# Patient Record
Sex: Male | Born: 1950 | Race: Black or African American | Hispanic: No | Marital: Married | Smoking: Current every day smoker
Health system: Southern US, Community
[De-identification: ages and names within clinical notes are randomized; demographics above are authoritative.]

---

## 2017-08-12 DIAGNOSIS — R69 Illness, unspecified: Secondary | ICD-10-CM | POA: Diagnosis not present

## 2017-10-24 DIAGNOSIS — G4733 Obstructive sleep apnea (adult) (pediatric): Secondary | ICD-10-CM | POA: Diagnosis not present

## 2018-03-26 DIAGNOSIS — M25561 Pain in right knee: Secondary | ICD-10-CM | POA: Diagnosis not present

## 2018-03-26 DIAGNOSIS — Z6841 Body Mass Index (BMI) 40.0 and over, adult: Secondary | ICD-10-CM | POA: Diagnosis not present

## 2018-08-13 DIAGNOSIS — M25562 Pain in left knee: Secondary | ICD-10-CM | POA: Diagnosis not present

## 2018-08-13 DIAGNOSIS — M170 Bilateral primary osteoarthritis of knee: Secondary | ICD-10-CM | POA: Diagnosis not present

## 2018-08-13 DIAGNOSIS — M25561 Pain in right knee: Secondary | ICD-10-CM | POA: Diagnosis not present

## 2018-08-21 DIAGNOSIS — M25561 Pain in right knee: Secondary | ICD-10-CM | POA: Diagnosis not present

## 2018-08-21 DIAGNOSIS — M1711 Unilateral primary osteoarthritis, right knee: Secondary | ICD-10-CM | POA: Diagnosis not present

## 2018-08-21 DIAGNOSIS — M25562 Pain in left knee: Secondary | ICD-10-CM | POA: Diagnosis not present

## 2018-08-21 DIAGNOSIS — M170 Bilateral primary osteoarthritis of knee: Secondary | ICD-10-CM | POA: Diagnosis not present

## 2018-08-28 DIAGNOSIS — R69 Illness, unspecified: Secondary | ICD-10-CM | POA: Diagnosis not present

## 2018-10-06 DIAGNOSIS — M25561 Pain in right knee: Secondary | ICD-10-CM | POA: Diagnosis not present

## 2018-10-06 DIAGNOSIS — M1711 Unilateral primary osteoarthritis, right knee: Secondary | ICD-10-CM | POA: Diagnosis not present

## 2018-11-25 DIAGNOSIS — M25462 Effusion, left knee: Secondary | ICD-10-CM | POA: Diagnosis not present

## 2018-11-25 DIAGNOSIS — M25461 Effusion, right knee: Secondary | ICD-10-CM | POA: Diagnosis not present

## 2018-11-25 DIAGNOSIS — M25561 Pain in right knee: Secondary | ICD-10-CM | POA: Diagnosis not present

## 2018-11-25 DIAGNOSIS — M170 Bilateral primary osteoarthritis of knee: Secondary | ICD-10-CM | POA: Diagnosis not present

## 2018-11-25 DIAGNOSIS — M25562 Pain in left knee: Secondary | ICD-10-CM | POA: Diagnosis not present

## 2019-03-25 DIAGNOSIS — L918 Other hypertrophic disorders of the skin: Secondary | ICD-10-CM | POA: Diagnosis not present

## 2019-03-25 DIAGNOSIS — L723 Sebaceous cyst: Secondary | ICD-10-CM | POA: Diagnosis not present

## 2019-03-25 DIAGNOSIS — I10 Essential (primary) hypertension: Secondary | ICD-10-CM | POA: Diagnosis not present

## 2019-03-25 DIAGNOSIS — M25561 Pain in right knee: Secondary | ICD-10-CM | POA: Diagnosis not present

## 2019-05-08 DIAGNOSIS — S0180XA Unspecified open wound of other part of head, initial encounter: Secondary | ICD-10-CM | POA: Diagnosis not present

## 2019-05-08 DIAGNOSIS — S0100XA Unspecified open wound of scalp, initial encounter: Secondary | ICD-10-CM | POA: Diagnosis not present

## 2019-05-15 DIAGNOSIS — Z4802 Encounter for removal of sutures: Secondary | ICD-10-CM | POA: Diagnosis not present

## 2019-08-06 DIAGNOSIS — R600 Localized edema: Secondary | ICD-10-CM | POA: Diagnosis not present

## 2019-08-06 DIAGNOSIS — M109 Gout, unspecified: Secondary | ICD-10-CM | POA: Diagnosis not present

## 2019-08-06 DIAGNOSIS — M79672 Pain in left foot: Secondary | ICD-10-CM | POA: Diagnosis not present

## 2019-08-06 DIAGNOSIS — I10 Essential (primary) hypertension: Secondary | ICD-10-CM | POA: Diagnosis not present

## 2019-09-11 DIAGNOSIS — R69 Illness, unspecified: Secondary | ICD-10-CM | POA: Diagnosis not present

## 2020-01-08 ENCOUNTER — Ambulatory Visit: Payer: Medicare HMO | Attending: Internal Medicine

## 2020-01-08 DIAGNOSIS — Z23 Encounter for immunization: Secondary | ICD-10-CM

## 2020-01-08 NOTE — Progress Notes (Signed)
   Covid-19 Vaccination Clinic  Name:  Kaiea Esselman    MRN: [id number] DOB: Oct 18, 1951  01/08/2020  Mr. Shaff was observed post Covid-19 immunization for 15 minutes without incidence. He was provided with Vaccine Information Sheet and instruction to access the V-Safe system.   Mr. Heckard was instructed to call 911 with any severe reactions post vaccine: Marland Kitchen Difficulty breathing  . Swelling of your face and throat  . A fast heartbeat  . A bad rash all over your body  . Dizziness and weakness    Immunizations Administered    Name Date Dose VIS Date Route   Pfizer COVID-19 Vaccine 01/08/2020 10:34 AM 0.3 mL 11/27/2019 Intramuscular   Manufacturer: [employer], [employer]   Lot: [id number]   NDC: [address]-1

## 2020-01-29 ENCOUNTER — Ambulatory Visit: Payer: Medicare HMO | Attending: Internal Medicine

## 2020-01-29 DIAGNOSIS — Z23 Encounter for immunization: Secondary | ICD-10-CM | POA: Insufficient documentation

## 2020-01-29 NOTE — Progress Notes (Signed)
   Covid-19 Vaccination Clinic  Name:  Todd Rasmussen    MRN: [id number] DOB: Jul 10, 1951  01/29/2020  Mr. Todd Rasmussen was observed post Covid-19 immunization for 15 minutes without incidence. He was provided with Vaccine Information Sheet and instruction to access the V-Safe system.   Mr. Todd Rasmussen was instructed to call 911 with any severe reactions post vaccine: Marland Kitchen Difficulty breathing  . Swelling of your face and throat  . A fast heartbeat  . A bad rash all over your body  . Dizziness and weakness    Immunizations Administered    Name Date Dose VIS Date Route   Pfizer COVID-19 Vaccine 01/29/2020  1:11 PM 0.3 mL 11/27/2019 Intramuscular   Manufacturer: [employer], [employer]   Lot: [id number]   NDC: [address]-8

## 2020-05-10 DIAGNOSIS — E669 Obesity, unspecified: Secondary | ICD-10-CM | POA: Diagnosis not present

## 2020-05-10 DIAGNOSIS — M109 Gout, unspecified: Secondary | ICD-10-CM | POA: Diagnosis not present

## 2020-05-10 DIAGNOSIS — G4733 Obstructive sleep apnea (adult) (pediatric): Secondary | ICD-10-CM | POA: Diagnosis not present

## 2020-05-10 DIAGNOSIS — J302 Other seasonal allergic rhinitis: Secondary | ICD-10-CM | POA: Diagnosis not present

## 2020-05-10 DIAGNOSIS — I10 Essential (primary) hypertension: Secondary | ICD-10-CM | POA: Diagnosis not present

## 2020-05-13 DIAGNOSIS — Z0000 Encounter for general adult medical examination without abnormal findings: Secondary | ICD-10-CM | POA: Diagnosis not present

## 2020-09-25 DIAGNOSIS — R69 Illness, unspecified: Secondary | ICD-10-CM | POA: Diagnosis not present

## 2020-11-17 DIAGNOSIS — M109 Gout, unspecified: Secondary | ICD-10-CM | POA: Diagnosis not present

## 2020-11-17 DIAGNOSIS — I10 Essential (primary) hypertension: Secondary | ICD-10-CM | POA: Diagnosis not present

## 2020-11-17 DIAGNOSIS — Z125 Encounter for screening for malignant neoplasm of prostate: Secondary | ICD-10-CM | POA: Diagnosis not present

## 2020-11-17 DIAGNOSIS — E291 Testicular hypofunction: Secondary | ICD-10-CM | POA: Diagnosis not present

## 2020-11-24 DIAGNOSIS — I10 Essential (primary) hypertension: Secondary | ICD-10-CM | POA: Diagnosis not present

## 2020-11-24 DIAGNOSIS — Z0000 Encounter for general adult medical examination without abnormal findings: Secondary | ICD-10-CM | POA: Diagnosis not present

## 2020-11-24 DIAGNOSIS — Z1212 Encounter for screening for malignant neoplasm of rectum: Secondary | ICD-10-CM | POA: Diagnosis not present

## 2020-11-24 DIAGNOSIS — E291 Testicular hypofunction: Secondary | ICD-10-CM | POA: Diagnosis not present

## 2020-11-24 DIAGNOSIS — R82998 Other abnormal findings in urine: Secondary | ICD-10-CM | POA: Diagnosis not present

## 2020-11-24 DIAGNOSIS — R768 Other specified abnormal immunological findings in serum: Secondary | ICD-10-CM | POA: Diagnosis not present

## 2020-11-24 DIAGNOSIS — K635 Polyp of colon: Secondary | ICD-10-CM | POA: Diagnosis not present

## 2020-11-24 DIAGNOSIS — R69 Illness, unspecified: Secondary | ICD-10-CM | POA: Diagnosis not present

## 2020-11-24 DIAGNOSIS — R7989 Other specified abnormal findings of blood chemistry: Secondary | ICD-10-CM | POA: Diagnosis not present

## 2020-11-24 DIAGNOSIS — E669 Obesity, unspecified: Secondary | ICD-10-CM | POA: Diagnosis not present

## 2020-11-24 DIAGNOSIS — M109 Gout, unspecified: Secondary | ICD-10-CM | POA: Diagnosis not present

## 2020-11-24 DIAGNOSIS — L918 Other hypertrophic disorders of the skin: Secondary | ICD-10-CM | POA: Diagnosis not present

## 2020-12-06 DIAGNOSIS — Z1212 Encounter for screening for malignant neoplasm of rectum: Secondary | ICD-10-CM | POA: Diagnosis not present

## 2021-07-04 ENCOUNTER — Other Ambulatory Visit: Payer: Self-pay

## 2021-07-04 ENCOUNTER — Other Ambulatory Visit (HOSPITAL_COMMUNITY): Payer: Self-pay | Admitting: Internal Medicine

## 2021-07-04 ENCOUNTER — Ambulatory Visit (HOSPITAL_COMMUNITY)
Admission: RE | Admit: 2021-07-04 | Discharge: 2021-07-04 | Disposition: A | Discharge status: Home / Self Care | Payer: Medicare Other | Source: Ambulatory Visit | Attending: Internal Medicine | Admitting: Internal Medicine

## 2021-07-04 DIAGNOSIS — M79604 Pain in right leg: Secondary | ICD-10-CM | POA: Diagnosis not present

## 2022-01-02 ENCOUNTER — Other Ambulatory Visit: Payer: Self-pay | Admitting: Internal Medicine

## 2022-01-02 DIAGNOSIS — R7989 Other specified abnormal findings of blood chemistry: Secondary | ICD-10-CM

## 2022-01-17 ENCOUNTER — Ambulatory Visit
Admission: RE | Admit: 2022-01-17 | Discharge: 2022-01-17 | Disposition: A | Discharge status: Home / Self Care | Payer: Medicare Other | Source: Ambulatory Visit | Attending: Internal Medicine | Admitting: Internal Medicine

## 2022-01-17 DIAGNOSIS — R7989 Other specified abnormal findings of blood chemistry: Secondary | ICD-10-CM

## 2022-05-24 ENCOUNTER — Other Ambulatory Visit: Payer: Self-pay

## 2022-05-24 ENCOUNTER — Inpatient Hospital Stay (HOSPITAL_COMMUNITY): Payer: Medicare Other

## 2022-05-24 ENCOUNTER — Emergency Department (HOSPITAL_COMMUNITY): Payer: Medicare Other

## 2022-05-24 ENCOUNTER — Encounter (HOSPITAL_COMMUNITY): Payer: Self-pay

## 2022-05-24 ENCOUNTER — Inpatient Hospital Stay (HOSPITAL_COMMUNITY)
Admission: EM | Admit: 2022-05-24 | Discharge: 2022-05-26 | DRG: 871 | Disposition: A | Discharge status: Home / Self Care | Payer: Medicare Other | Source: Ambulatory Visit | Attending: Internal Medicine | Admitting: Internal Medicine

## 2022-05-24 DIAGNOSIS — R9431 Abnormal electrocardiogram [ECG] [EKG]: Secondary | ICD-10-CM | POA: Diagnosis present

## 2022-05-24 DIAGNOSIS — E875 Hyperkalemia: Secondary | ICD-10-CM | POA: Diagnosis present

## 2022-05-24 DIAGNOSIS — Z6837 Body mass index (BMI) 37.0-37.9, adult: Secondary | ICD-10-CM | POA: Diagnosis not present

## 2022-05-24 DIAGNOSIS — Z833 Family history of diabetes mellitus: Secondary | ICD-10-CM | POA: Diagnosis not present

## 2022-05-24 DIAGNOSIS — R1310 Dysphagia, unspecified: Secondary | ICD-10-CM | POA: Diagnosis present

## 2022-05-24 DIAGNOSIS — Z79899 Other long term (current) drug therapy: Secondary | ICD-10-CM

## 2022-05-24 DIAGNOSIS — L0211 Cutaneous abscess of neck: Secondary | ICD-10-CM | POA: Diagnosis present

## 2022-05-24 DIAGNOSIS — I10 Essential (primary) hypertension: Secondary | ICD-10-CM | POA: Diagnosis present

## 2022-05-24 DIAGNOSIS — B9689 Other specified bacterial agents as the cause of diseases classified elsewhere: Secondary | ICD-10-CM | POA: Diagnosis not present

## 2022-05-24 DIAGNOSIS — T783XXA Angioneurotic edema, initial encounter: Secondary | ICD-10-CM | POA: Diagnosis present

## 2022-05-24 DIAGNOSIS — K1120 Sialoadenitis, unspecified: Secondary | ICD-10-CM | POA: Diagnosis present

## 2022-05-24 DIAGNOSIS — H9319 Tinnitus, unspecified ear: Secondary | ICD-10-CM | POA: Diagnosis present

## 2022-05-24 DIAGNOSIS — N281 Cyst of kidney, acquired: Secondary | ICD-10-CM | POA: Diagnosis present

## 2022-05-24 DIAGNOSIS — R220 Localized swelling, mass and lump, head: Secondary | ICD-10-CM | POA: Diagnosis present

## 2022-05-24 DIAGNOSIS — E876 Hypokalemia: Secondary | ICD-10-CM | POA: Diagnosis present

## 2022-05-24 DIAGNOSIS — G4733 Obstructive sleep apnea (adult) (pediatric): Secondary | ICD-10-CM | POA: Diagnosis present

## 2022-05-24 DIAGNOSIS — A419 Sepsis, unspecified organism: Secondary | ICD-10-CM | POA: Diagnosis present

## 2022-05-24 DIAGNOSIS — R739 Hyperglycemia, unspecified: Secondary | ICD-10-CM | POA: Diagnosis present

## 2022-05-24 DIAGNOSIS — J9601 Acute respiratory failure with hypoxia: Secondary | ICD-10-CM | POA: Diagnosis present

## 2022-05-24 DIAGNOSIS — K760 Fatty (change of) liver, not elsewhere classified: Secondary | ICD-10-CM | POA: Diagnosis present

## 2022-05-24 DIAGNOSIS — F17210 Nicotine dependence, cigarettes, uncomplicated: Secondary | ICD-10-CM | POA: Diagnosis present

## 2022-05-24 DIAGNOSIS — M109 Gout, unspecified: Secondary | ICD-10-CM | POA: Diagnosis present

## 2022-05-24 DIAGNOSIS — E871 Hypo-osmolality and hyponatremia: Secondary | ICD-10-CM | POA: Diagnosis present

## 2022-05-24 DIAGNOSIS — K1121 Acute sialoadenitis: Secondary | ICD-10-CM

## 2022-05-24 DIAGNOSIS — I4510 Unspecified right bundle-branch block: Secondary | ICD-10-CM | POA: Diagnosis present

## 2022-05-24 LAB — CBC WITH DIFFERENTIAL/PLATELET
Abs Immature Granulocytes: 0.05 10*3/uL (ref 0.00–0.07)
Basophils Absolute: 0.1 10*3/uL (ref 0.0–0.1)
Basophils Relative: 0 %
Eosinophils Absolute: 0.2 10*3/uL (ref 0.0–0.5)
Eosinophils Relative: 1 %
HCT: 45 % (ref 39.0–52.0)
Hemoglobin: 14.9 g/dL (ref 13.0–17.0)
Immature Granulocytes: 0 %
Lymphocytes Relative: 15 %
Lymphs Abs: 2.2 10*3/uL (ref 0.7–4.0)
MCH: 29.2 pg (ref 26.0–34.0)
MCHC: 33.1 g/dL (ref 30.0–36.0)
MCV: 88.2 fL (ref 80.0–100.0)
Monocytes Absolute: 1 10*3/uL (ref 0.1–1.0)
Monocytes Relative: 7 %
Neutro Abs: 10.7 10*3/uL — ABNORMAL HIGH (ref 1.7–7.7)
Neutrophils Relative %: 77 %
Platelets: 369 10*3/uL (ref 150–400)
RBC: 5.1 MIL/uL (ref 4.22–5.81)
RDW: 16 % — ABNORMAL HIGH (ref 11.5–15.5)
WBC: 14.1 10*3/uL — ABNORMAL HIGH (ref 4.0–10.5)
nRBC: 0 % (ref 0.0–0.2)

## 2022-05-24 LAB — PHOSPHORUS: Phosphorus: 2.5 mg/dL (ref 2.5–4.6)

## 2022-05-24 LAB — COMPREHENSIVE METABOLIC PANEL
ALT: 24 U/L (ref 0–44)
AST: 44 U/L — ABNORMAL HIGH (ref 15–41)
Albumin: 3.6 g/dL (ref 3.5–5.0)
Alkaline Phosphatase: 56 U/L (ref 38–126)
Anion gap: 8 (ref 5–15)
BUN: 11 mg/dL (ref 8–23)
CO2: 29 mmol/L (ref 22–32)
Calcium: 8.2 mg/dL — ABNORMAL LOW (ref 8.9–10.3)
Chloride: 102 mmol/L (ref 98–111)
Creatinine, Ser: 0.73 mg/dL (ref 0.61–1.24)
GFR, Estimated: >60 mL/min (ref >60)
Glucose, Bld: 88 mg/dL (ref 70–99)
Potassium: 5.9 mmol/L — ABNORMAL HIGH (ref 3.5–5.1)
Sodium: 139 mmol/L (ref 135–145)
Total Bilirubin: 2.8 mg/dL — ABNORMAL HIGH (ref 0.3–1.2)
Total Protein: 8.5 g/dL — ABNORMAL HIGH (ref 6.5–8.1)

## 2022-05-24 LAB — HEPATIC FUNCTION PANEL
ALT: 21 U/L (ref 0–44)
AST: 19 U/L (ref 15–41)
Albumin: 3.1 g/dL — ABNORMAL LOW (ref 3.5–5.0)
Alkaline Phosphatase: 48 U/L (ref 38–126)
Bilirubin, Direct: 0.3 mg/dL — ABNORMAL HIGH (ref 0.0–0.2)
Indirect Bilirubin: 1.1 mg/dL — ABNORMAL HIGH (ref 0.3–0.9)
Total Bilirubin: 1.4 mg/dL — ABNORMAL HIGH (ref 0.3–1.2)
Total Protein: 7.4 g/dL (ref 6.5–8.1)

## 2022-05-24 LAB — BASIC METABOLIC PANEL
Anion gap: 11 (ref 5–15)
BUN: 10 mg/dL (ref 8–23)
CO2: 24 mmol/L (ref 22–32)
Calcium: 7.9 mg/dL — ABNORMAL LOW (ref 8.9–10.3)
Chloride: 96 mmol/L — ABNORMAL LOW (ref 98–111)
Creatinine, Ser: 0.86 mg/dL (ref 0.61–1.24)
GFR, Estimated: >60 mL/min (ref >60)
Glucose, Bld: 399 mg/dL — ABNORMAL HIGH (ref 70–99)
Potassium: 3 mmol/L — ABNORMAL LOW (ref 3.5–5.1)
Sodium: 131 mmol/L — ABNORMAL LOW (ref 135–145)

## 2022-05-24 LAB — MAGNESIUM: Magnesium: 1.9 mg/dL (ref 1.7–2.4)

## 2022-05-24 LAB — CK: Total CK: 77 U/L (ref 49–397)

## 2022-05-24 LAB — URIC ACID: Uric Acid, Serum: 6.1 mg/dL (ref 3.7–8.6)

## 2022-05-24 LAB — LACTIC ACID, PLASMA
Lactic Acid, Venous: 1.2 mmol/L (ref 0.5–1.9)
Lactic Acid, Venous: 1.2 mmol/L (ref 0.5–1.9)

## 2022-05-24 LAB — LACTATE DEHYDROGENASE: LDH: 170 U/L (ref 98–192)

## 2022-05-24 MED ORDER — METRONIDAZOLE 500 MG/100ML IV SOLN
500.0000 mg | Freq: Once | INTRAVENOUS | Status: AC
  Administered 2022-05-24: 500 mg via INTRAVENOUS
  Filled 2022-05-24: qty 100

## 2022-05-24 MED ORDER — IOHEXOL 300 MG/ML  SOLN
80.0000 mL | Freq: Once | INTRAMUSCULAR | Status: AC | PRN
  Administered 2022-05-24: 75 mL via INTRAVENOUS

## 2022-05-24 MED ORDER — CIPROFLOXACIN IN D5W 400 MG/200ML IV SOLN
400.0000 mg | Freq: Once | INTRAVENOUS | Status: AC
  Administered 2022-05-24: 400 mg via INTRAVENOUS
  Filled 2022-05-24: qty 200

## 2022-05-24 MED ORDER — FENTANYL CITRATE PF 50 MCG/ML IJ SOSY
50.0000 ug | PREFILLED_SYRINGE | Freq: Once | INTRAMUSCULAR | Status: AC
  Administered 2022-05-24: 50 ug via INTRAVENOUS
  Filled 2022-05-24: qty 1

## 2022-05-24 MED ORDER — SODIUM CHLORIDE 0.9 % IV BOLUS
1000.0000 mL | Freq: Once | INTRAVENOUS | Status: AC
  Administered 2022-05-24: 1000 mL via INTRAVENOUS

## 2022-05-24 MED ORDER — INSULIN ASPART 100 UNIT/ML IJ SOLN
0.0000 [IU] | INTRAMUSCULAR | Status: DC
  Filled 2022-05-24: qty 0.09

## 2022-05-24 MED ORDER — HYDRALAZINE HCL 20 MG/ML IJ SOLN
10.0000 mg | INTRAMUSCULAR | Status: AC
  Administered 2022-05-24: 10 mg via INTRAVENOUS
  Filled 2022-05-24: qty 1

## 2022-05-24 MED ORDER — DEXAMETHASONE SODIUM PHOSPHATE 10 MG/ML IJ SOLN
10.0000 mg | Freq: Once | INTRAMUSCULAR | Status: AC
  Administered 2022-05-24: 10 mg via INTRAVENOUS
  Filled 2022-05-24: qty 1

## 2022-05-24 MED ORDER — CIPROFLOXACIN IN D5W 400 MG/200ML IV SOLN
400.0000 mg | Freq: Two times a day (BID) | INTRAVENOUS | Status: DC
Start: 2022-05-25 — End: 2022-05-25
  Administered 2022-05-25: 400 mg via INTRAVENOUS
  Filled 2022-05-24: qty 200

## 2022-05-24 NOTE — ED Triage Notes (Signed)
Patient present from his MD office with complaints of angioedema and fever for 2 days. Swelling can be found under his neck and on the right side of his tongue. He was also hypertensive and SPO2 was 88% on room air at the MD office.     EMS vitals: 160/100 BP 90 HR 98 % SPO2 on room air 95 CBG 18 RR

## 2022-05-24 NOTE — Assessment & Plan Note (Signed)
In the setting of initial max swelling.  For completion will obtain chest x-ray.  Noted to be hypoxic down to mid 80s on room air while at the PCP office. Provide oxygen as needed incentive spirometry.  Monitor in progressive care continuous pulse ox

## 2022-05-24 NOTE — Progress Notes (Signed)
Pharmacy Antibiotic Note  Todd Rasmussen is a 71 y.o. male admitted on 05/24/2022 with neck infection.  In the ED patient has received Cipro 400mg  IV and Flagyl 500mg  IV x 1 dose each.  Pharmacy has been consulted for Cipro dosing.  SCr = 0.73 with estimated CrCl ~ 88 ml/min  Plan: Cipro 400mg  IV q12h Need for further dosage adjustment appears unlikely at present.    Will sign off at this time.  Please reconsult if a change in clinical status warrants re-evaluation of dosage.     Temp (24hrs), Avg:98.8 F (37.1 C), Min:98 F (36.7 C), Max:99.6 F (37.6 C)  Recent Labs  Lab 05/24/22 1754 05/24/22 2000  WBC 14.1*  --   CREATININE 0.73  --   LATICACIDVEN 1.2 1.2    CrCl cannot be calculated (Unknown ideal weight.).     Thank you for allowing pharmacy to be a part of this patient's care.  , PharmD 05/24/2022 11:18 PM

## 2022-05-24 NOTE — ED Provider Notes (Signed)
[facility]-EMERGENCY DEPT Provider Note   CSN: [id number] Arrival date & time: 05/24/22  1705     History  Chief Complaint  Patient presents with   Facial Swelling    Todd BeathDouglas Nave is a 71 y.o. male.  HPI Patient presents with concern of neck swelling and discomfort.  Onset seems to have been about 2 days ago, since that time swelling has become pronounced there is difficulty swallowing, speaking, breathing.  He notes some improvement with oxygen provided by EMS transport.  He started losartan about 1 week ago, otherwise no recent medication change, diet change, activity change.  Currently no chest pain, no abdominal pain.    Home Medications Prior to Admission medications   Not on File      Allergies    Patient has no allergy information on record.    Review of Systems   Review of Systems  All other systems reviewed and are negative.   Physical Exam Updated Vital Signs BP (!) 194/100   Pulse 81   Temp 98 F (36.7 C) (Oral)   Resp 17   SpO2 96%  Physical Exam Vitals and nursing note reviewed.  Constitutional:      General: He is not in acute distress.    Appearance: He is well-developed. He is ill-appearing.  HENT:     Head: Normocephalic and atraumatic.  Eyes:     Conjunctiva/sclera: Conjunctivae normal.  Neck:   Cardiovascular:     Rate and Rhythm: Normal rate and regular rhythm.  Pulmonary:     Effort: Pulmonary effort is normal. No respiratory distress.     Breath sounds: No stridor.  Abdominal:     General: There is no distension.  Skin:    General: Skin is warm and dry.  Neurological:     Mental Status: He is alert and oriented to person, place, and time.     ED Results / Procedures / Treatments   Labs (all labs ordered are listed, but only abnormal results are displayed) Labs Reviewed  COMPREHENSIVE METABOLIC PANEL - Abnormal; Notable for the following components:      Result Value   Potassium 5.9 (*)    Calcium 8.2  (*)    Total Protein 8.5 (*)    AST 44 (*)    Total Bilirubin 2.8 (*)    All other components within normal limits  CBC WITH DIFFERENTIAL/PLATELET - Abnormal; Notable for the following components:   WBC 14.1 (*)    RDW 16.0 (*)    Neutro Abs 10.7 (*)    All other components within normal limits  LACTIC ACID, PLASMA  LACTIC ACID, PLASMA    EKG None  Radiology CT Soft Tissue Neck W Contrast  Result Date: 05/24/2022 CLINICAL DATA:  Right-sided edema with tenderness to palpation EXAM: CT NECK WITH CONTRAST TECHNIQUE: Multidetector CT imaging of the neck was performed using the standard protocol following the bolus administration of intravenous contrast. RADIATION DOSE REDUCTION: This exam was performed according to the departmental dose-optimization program which includes automated exposure control, adjustment of the mA and/or kV according to patient size and/or use of iterative reconstruction technique. CONTRAST:  75mL OMNIPAQUE IOHEXOL 300 MG/ML  SOLN COMPARISON:  None Available. FINDINGS: Pharynx and larynx: The tonsils are unremarkable. There is no retropharyngeal or peritonsillar fluid collection. Much of the oral cavity is obscured by streak artifact dental amalgam. The right half of the vallecula is effaced due to edema within right parapharyngeal soft tissues. Salivary glands: Right submandibular  gland is enlarged and hyperenhancing. There is a 3 mm calcification near the orifice of the right submandibular duct (series 3, image 49). There is marked edema surrounding the right submandibular gland with thickening of the right platysma. Thyroid: Normal Lymph nodes: There are bilateral reactive subcentimeter lymph nodes. Vascular: Negative. Limited intracranial: Negative. Visualized orbits: Negative. Mastoids and visualized paranasal sinuses: Clear. Skeleton: No acute or aggressive process. Upper chest: Negative. Other: None. IMPRESSION: 1. Right facial inflammation likely due to acute right  submandibular sialadenitis with 3 mm calcification near the orifice of the right submandibular duct, likely an obstructing stone. 2. No abscess or drainable fluid collection. 3. Reactive lymphadenopathy. Electronically Signed   By: Deatra Robinson M.D.   On: 05/24/2022 19:45    Procedures Procedures    Medications Ordered in ED Medications  dexamethasone (DECADRON) injection 10 mg (has no administration in time range)  metroNIDAZOLE (FLAGYL) IVPB 500 mg (has no administration in time range)  ciprofloxacin (CIPRO) IVPB 400 mg (has no administration in time range)  fentaNYL (SUBLIMAZE) injection 50 mcg (has no administration in time range)  sodium chloride 0.9 % bolus 1,000 mL (0 mLs Intravenous Stopped 05/24/22 1948)  iohexol (OMNIPAQUE) 300 MG/ML solution 80 mL (75 mLs Intravenous Contrast Given 05/24/22 1911)  hydrALAZINE (APRESOLINE) injection 10 mg (10 mg Intravenous Given 05/24/22 2039)    ED Course/ Medical Decision Making/ A&P This patient with a Hx of hypertension presents to the ED for concern of asymmetric neck swelling, this involves an extensive number of treatment options, and is a complaint that carries with it a high risk of complications and morbidity.    The differential diagnosis includes deep space infection, medication reaction to Zetia started losartan   Social Determinants of Health:  Age  Additional history obtained:  Additional history and/or information obtained from primary care notes from earlier today, notable for concern for neck swelling, recent start of losartan   After the initial evaluation, orders, including: CT, labs were initiated.   Patient placed on Cardiac and Pulse-Oximetry Monitors. The patient was maintained on a cardiac monitor.  The cardiac monitored showed an rhythm of 80 sinus normal The patient was also maintained on pulse oximetry. The readings were typically 100% room air normal   On repeat evaluation of the patient stayed the  same  Lab Tests:  I personally interpreted labs.  The pertinent results include: Mild hyperkalemia, mild leukocytosis  Imaging Studies ordered:  I independently visualized and interpreted imaging which showed inflammation surrounding the submandibular salivary gland, right-sided, with platysma involvement I agree with the radiologist interpretation  Consultations Obtained:  I requested consultation with the ENT, Dr. Suszanne Conners,  and discussed lab and imaging findings as well as pertinent plan - they recommend: Patient will start antibiotics.  With concern for trismus, inability to take oral, patient started IV meds.  Dr. Suszanne Conners will consult, see the patient tomorrow on rounds.  Dispostion / Final MDM:  After consideration of the diagnostic results and the patient's response to treatment, this adult male presents with new facial swelling, trismus, inability to take oral meds.  Patient found to be hypertensive, though this may be secondary to pain, he is otherwise in no distress, but has notable abnormal neck exam and head CT is found to have submandibular gland inflammation, infection, discussed with ENT.  No evidence for bacteremia, sepsis with no fever, no hypotension.  Patient's daughter also aware of all findings, need for admission.  Final Clinical Impression(s) / ED Diagnoses Final diagnoses:  Submandibular gland infection     Gerhard Munch, MD 05/24/22 2132

## 2022-05-24 NOTE — Assessment & Plan Note (Addendum)
Unclear etiology we will repeat Repeat labs appear drastically abnormal wit BG up to 399 Ordered CBG and BG 127 not consistent with lab Will repeat again

## 2022-05-24 NOTE — H&P (Signed)
Todd Rasmussen [id number] DOB: [birth year] DOA: 05/24/2022     PCP: Shon Baton, MD   Outpatient Specialists:  NONE    Patient arrived to ER on 05/24/22 at 1705 Referred by Attending Carmin Muskrat, MD   Patient coming from:    home Lives With family    Chief Complaint:  Chief Complaint  Patient presents with   Facial Swelling    HPI: Todd Rasmussen is a 71 y.o. male with medical history significant of obesity hypertension gout    Presented with   facial swelling Came in with neck pain swelling and trismus No fever no nausea no vomiitng  Reports he have had a little bit of swelling for the past few months but for 2 days or so it has gotten severe and has severe pain 10 out of 10 with tongue swelling as well Patient tried to use Benadryl to see if that would help his tongue seem to be interfering with eating and drinking. Patient initially was brought into urgent care and from there was sent immediately to ER.  Negative for strep flu and COVID Noted to be hypoxic on arrival with sats down to 80s started on 3 L Per review of history she states that he did have a fever at home Otherwise no abdominal pain no nausea vomiting  Of note pt was having leg swelling on amlodipine so 1 k ago he was changed to losartan   Smokes 3-5 cig a week etOH every other day 1 drink Pt states while at MD office TEM was 103 on arrival to ER no fever Also was hypoxic at PCP now sats 95% on RA   Regarding pertinent Chronic problems:       HTN on Norvasc   Gout on losartan  OSA no using CPAP  While in ER:   Hypertensive on arrival given hydralazine CT scan showed right facial inflammation due to acute right submandibular was solid tinnitus there is an obstructing stone no abscess or drainable fluid collection ENT was consulted recommended steroids and antibiotics will see in the morning  Following Medications were ordered in ER: Medications  metroNIDAZOLE (FLAGYL) IVPB 500 mg (has  no administration in time range)  ciprofloxacin (CIPRO) IVPB 400 mg (400 mg Intravenous New Bag/Given 05/24/22 2150)  sodium chloride 0.9 % bolus 1,000 mL (0 mLs Intravenous Stopped 05/24/22 1948)  iohexol (OMNIPAQUE) 300 MG/ML solution 80 mL (75 mLs Intravenous Contrast Given 05/24/22 1911)  hydrALAZINE (APRESOLINE) injection 10 mg (10 mg Intravenous Given 05/24/22 2039)  dexamethasone (DECADRON) injection 10 mg (10 mg Intravenous Given 05/24/22 2141)  fentaNYL (SUBLIMAZE) injection 50 mcg (50 mcg Intravenous Given 05/24/22 2140)    _______________________________________________________ ER Provider Called:   ENT  Dr. Benjamine Mola They Recommend admit to medicine  Will see in AM     ED Triage Vitals  Enc Vitals Group     BP 05/24/22 1719 (!) 203/106     Pulse Rate 05/24/22 1719 (P) 79     Resp 05/24/22 1719 16     Temp 05/24/22 1719 99.6 F (37.6 C)     Temp Source 05/24/22 1719 Oral     SpO2 05/24/22 1719 (P) 96 %     Weight --      Height --      Head Circumference --      Peak Flow --      Pain Score 05/24/22 1734 0     Pain Loc --      Pain Edu? --  Excl. in [place]? --   [id number]     _________________________________________ Significant initial  Findings: Abnormal Labs Reviewed  COMPREHENSIVE METABOLIC PANEL - Abnormal; Notable for the following components:      Result Value   Potassium 5.9 (*)    Calcium 8.2 (*)    Total Protein 8.5 (*)    AST 44 (*)    Total Bilirubin 2.8 (*)    All other components within normal limits  CBC WITH DIFFERENTIAL/PLATELET - Abnormal; Notable for the following components:   WBC 14.1 (*)    RDW 16.0 (*)    Neutro Abs 10.7 (*)    All other components within normal limits     ECG:     HR 85  SR RBBB LAFB Left ventricular hypertrophy QTC 506  The recent clinical data is shown below. Vitals:   05/24/22 1950 05/24/22 2045 05/24/22 2100 05/24/22 2200  BP:  (!) 194/100 (!) 188/97 (!) 175/83  Pulse: 78 81 77 76  Resp:  17 19 19   Temp:       TempSrc:      SpO2: 96% 96% 96% 93%     WBC     Component Value Date/Time   WBC 14.1 (H) 05/24/2022 1754   LYMPHSABS 2.2 05/24/2022 1754   MONOABS 1.0 05/24/2022 1754   EOSABS 0.2 05/24/2022 1754   BASOSABS 0.1 05/24/2022 1754    Lactic Acid, Venous    Component Value Date/Time   LATICACIDVEN 1.2 05/24/2022 2000      _______________________________________________ Hospitalist was called for admission for   Submandibular gland infection    The following Work up has been ordered so far:  Orders Placed This Encounter  Procedures   CT Soft Tissue Neck W Contrast   Comprehensive metabolic panel   CBC with Differential   Lactic acid, plasma   Consult to ear nose and throat   Consult to hospitalist     OTHER Significant initial  Findings:  labs showing:    Recent Labs  Lab 05/24/22 1754 05/24/22 2233  NA 139 131*  K 5.9* 3.0*  CO2 29 24  GLUCOSE 88 399*  BUN 11 10  CREATININE 0.73 0.86  CALCIUM 8.2* 7.9*  MG  --  1.9  PHOS  --  2.5    Cr   stable,    Lab Results  Component Value Date   CREATININE 0.73 05/24/2022    Recent Labs  Lab 05/24/22 1754  AST 44*  ALT 24  ALKPHOS 56  BILITOT 2.8*  PROT 8.5*  ALBUMIN 3.6   Lab Results  Component Value Date   CALCIUM 8.2 (L) 05/24/2022       Plt: Lab Results  Component Value Date   PLT 369 05/24/2022       Recent Labs  Lab 05/24/22 1754  WBC 14.1*  NEUTROABS 10.7*  HGB 14.9  HCT 45.0  MCV 88.2  PLT 369    HG/HCT   stable,       Component Value Date/Time   HGB 14.9 05/24/2022 1754   HCT 45.0 05/24/2022 1754   MCV 88.2 05/24/2022 1754     Cardiac Panel (last 3 results) Recent Labs    05/24/22 2233  CKTOTAL 77       Cultures: No results found for: "SDES", "SPECREQUEST", "CULT", "REPTSTATUS"   Radiological Exams on Admission: CT Soft Tissue Neck W Contrast  Result Date: 05/24/2022 CLINICAL DATA:  Right-sided edema with tenderness to palpation EXAM: CT NECK WITH CONTRAST  TECHNIQUE:  Multidetector CT imaging of the neck was performed using the standard protocol following the bolus administration of intravenous contrast. RADIATION DOSE REDUCTION: This exam was performed according to the departmental dose-optimization program which includes automated exposure control, adjustment of the mA and/or kV according to patient size and/or use of iterative reconstruction technique. CONTRAST:  33mL OMNIPAQUE IOHEXOL 300 MG/ML  SOLN COMPARISON:  None Available. FINDINGS: Pharynx and larynx: The tonsils are unremarkable. There is no retropharyngeal or peritonsillar fluid collection. Much of the oral cavity is obscured by streak artifact dental amalgam. The right half of the vallecula is effaced due to edema within right parapharyngeal soft tissues. Salivary glands: Right submandibular gland is enlarged and hyperenhancing. There is a 3 mm calcification near the orifice of the right submandibular duct (series 3, image 49). There is marked edema surrounding the right submandibular gland with thickening of the right platysma. Thyroid: Normal Lymph nodes: There are bilateral reactive subcentimeter lymph nodes. Vascular: Negative. Limited intracranial: Negative. Visualized orbits: Negative. Mastoids and visualized paranasal sinuses: Clear. Skeleton: No acute or aggressive process. Upper chest: Negative. Other: None. IMPRESSION: 1. Right facial inflammation likely due to acute right submandibular sialadenitis with 3 mm calcification near the orifice of the right submandibular duct, likely an obstructing stone. 2. No abscess or drainable fluid collection. 3. Reactive lymphadenopathy. Electronically Signed   By: Ulyses Jarred M.D.   On: 05/24/2022 19:45   _______________________________________________________________________________________________________ Latest  Blood pressure (!) 175/83, pulse 76, temperature 98 F (36.7 C), temperature source Oral, resp. rate 19, SpO2 93 %.   Vitals  labs and  radiology finding personally reviewed  Review of Systems:    Pertinent positives include:   Fevers, chills, fatigue,   shortness of breath at rest.  Constitutional:  No weight loss, night sweats,weight loss  HEENT:  No headaches, Difficulty swallowing,Tooth/dental problems,Sore throat,  No sneezing, itching, ear ache, nasal congestion, post nasal drip,  Cardio-vascular:  No chest pain, Orthopnea, PND, anasarca, dizziness, palpitations.no Bilateral lower extremity swelling  GI:  No heartburn, indigestion, abdominal pain, nausea, vomiting, diarrhea, change in bowel habits, loss of appetite, melena, blood in stool, hematemesis Resp:  noNo dyspnea on exertion, No excess mucus, no productive cough, No non-productive cough, No coughing up of blood.No change in color of mucus.No wheezing. Skin:  no rash or lesions. No jaundice GU:  no dysuria, change in color of urine, no urgency or frequency. No straining to urinate.  No flank pain.  Musculoskeletal:  No joint pain or no joint swelling. No decreased range of motion. No back pain.  Psych:  No change in mood or affect. No depression or anxiety. No memory loss.  Neuro: no localizing neurological complaints, no tingling, no weakness, no double vision, no gait abnormality, no slurred speech, no confusion  All systems reviewed and apart from [place] all are negative _______________________________________________________________________________________________ Past Medical History:  History reviewed. No pertinent past medical history.    History reviewed. No pertinent surgical history.  Social History:  Ambulatory   independently      reports that he has been smoking cigarettes. He has never used smokeless tobacco. He reports current alcohol use. No history on file for drug use.    Family History: mother w hx of DM2   ______________________________________________________________________________________________ Allergies: Not on  File   Prior to Admission medications   Not on File    ___________________________________________________________________________________________________ Physical Exam:    05/24/2022   10:00 PM 05/24/2022    9:00 PM 05/24/2022    8:45 PM  Vitals with BMI  Systolic 0000000 0000000 [id number]  Diastolic 83 97 [id number]  Pulse 76 77 81     1. General:  in No  Acute distress spitting up large amount of saliva   acutely ill -appearing 2. Psychological: Alert and   Oriented 3. Head/ENT:   Moist  Mucous Membranes                          Head Non traumatic, neck supple Large neck swelling noted on the right, small cyst on the left                                   Normal Dentition 4. SKIN: normal   Skin turgor,  Skin clean Dry and intact no rash 5. Heart: Regular rate and rhythm no  Murmur, no Rub or gallop 6. Lungs: , no wheezes or crackles   7. Abdomen: Soft,  non-tender, Non distended   obese  bowel sounds present 8. Lower extremities: no clubbing, cyanosis, no  edema 9. Neurologically Grossly intact, moving all 4 extremities equally   10. MSK: Normal range of motion    Chart has been reviewed  ______________________________________________________________________________________________  Assessment/Plan  71 y.o. male with medical history significant of obesity hypertension gout     Admitted for   Submandibular gland infection       Present on Admission:  Acute bacterial sialadenitis  Hyperkalemia  Acute respiratory failure with hypoxia (HCC)  Angioedema  Hyperglycemia  Abnormal ECG     Acute bacterial sialadenitis Continue antibiotics metronidazole and Cipro continue steroids appreciate ENT consult.  Monitor on progressive Continuous pulse ox Monitor airway Appreciate PCCM consult   Hyperkalemia Unclear etiology we will repeat Repeat labs appear drastically abnormal wit BG up to 399 Ordered CBG and BG 127 not consistent with lab Will repeat again  Acute respiratory  failure with hypoxia ([place]) In the setting of initial max swelling.  For completion will obtain chest x-ray.  Noted to be hypoxic down to mid 80s on room air while at the PCP office. Provide oxygen as needed incentive spirometry.  Monitor in progressive care continuous pulse ox  Angioedema angioedema on deferential as well given recent use of Arb Will dc losartan for now Given pt unable to swallow secretions will keep npo  re consult ENT Monitor in step down Monitor airway  Hyperglycemia uncler if true hyperglycemia cbg 127 Will repeat labs  Abnormal ECG No prior ECG No CP Obtain  trop and ECHO Not meeting STEMI criteria   Other plan as per orders.  DVT prophylaxis:  SCD     Code Status:    Code Status: Not on file FULL CODE  as per patient  family  I had personally discussed CODE STATUS with patient and family     Family Communication:   Family  at  Bedside  plan of care was discussed   with   Daughter,    Disposition Plan:       To home once workup is complete and patient is stable   Following barriers for discharge:                            Electrolytes corrected  white count improving able to transition to PO antibiotics                             Will need to be able to tolerate PO                                                        Will need consultants to evaluate patient prior to discharge                       Consults called: ENT is aware, PCCM is aware  Admission status:  ED Disposition     ED Disposition  Admit   Condition  --   [place]: [place] [[id number]]  Level of Care: Progressive [102]  Admit to Progressive based on following criteria: MULTISYSTEM THREATS such as stable sepsis, metabolic/electrolyte imbalance with or without encephalopathy that is responding to early treatment.  May admit patient to [facility] or [facility] if equivalent level of care is available:: No  Covid  Evaluation: Asymptomatic - no recent exposure (last 10 days) testing not required  Diagnosis: Neck abscess [id number]  Admitting Physician: Toy Baker [[id number]]  Attending Physician: Toy Baker [[id number]]  Estimated length of stay: past midnight tomorrow  Certification:: I certify this patient will need inpatient services for at least 2 midnights            inpatient     I Expect 2 midnight stay secondary to severity of patient's current illness need for inpatient interventions justified by the following:  hemodynamic instability despite optimal treatment (tachycardia hypoxia,  )  Severe lab/radiological/exam abnormalities including:   Submandibular lymphadenopathy and extensive comorbidities including:  Obesity    That are currently affecting medical management.   I expect  patient to be hospitalized for 2 midnights requiring inpatient medical care.  Patient is at high risk for adverse outcome (such as loss of life or disability) if not treated.  Indication for inpatient stay as follows:    Need for operative/procedural  intervention New or worsening hypoxia   Need for IV antibiotics, IV fluids, IV pain meds    Level of care        progressive tele indefinitely please discontinue once patient no longer qualifies COVID-19 Labs      Brigett Estell 05/25/2022, 1:03 AM    [facility]     after 2 AM please page floor coverage PA If 7AM-7PM, please contact the day team taking care of the patient using [email or web address]   Patient was evaluated in the context of the global COVID-19 pandemic, which necessitated consideration that the patient might be at risk for infection with the SARS-CoV-2 virus that causes COVID-19. Institutional protocols and algorithms that pertain to the evaluation of patients at risk for COVID-19 are in a state of rapid change based on information released by regulatory bodies including the CDC and federal and state organizations. These  policies and algorithms were followed during the patient's care.

## 2022-05-24 NOTE — Subjective & Objective (Signed)
Came in with neck pain swelling and trismus No fever no nausea no vomiitng

## 2022-05-24 NOTE — Assessment & Plan Note (Addendum)
Continue antibiotics metronidazole and Cipro continue steroids appreciate ENT consult.  Monitor on progressive Continuous pulse ox Monitor airway Appreciate PCCM consult

## 2022-05-25 ENCOUNTER — Encounter (HOSPITAL_COMMUNITY): Payer: Self-pay | Admitting: Internal Medicine

## 2022-05-25 ENCOUNTER — Inpatient Hospital Stay (HOSPITAL_COMMUNITY): Payer: Medicare Other

## 2022-05-25 DIAGNOSIS — K1120 Sialoadenitis, unspecified: Secondary | ICD-10-CM

## 2022-05-25 DIAGNOSIS — R9431 Abnormal electrocardiogram [ECG] [EKG]: Secondary | ICD-10-CM

## 2022-05-25 DIAGNOSIS — T783XXA Angioneurotic edema, initial encounter: Secondary | ICD-10-CM | POA: Diagnosis present

## 2022-05-25 DIAGNOSIS — K760 Fatty (change of) liver, not elsewhere classified: Secondary | ICD-10-CM

## 2022-05-25 DIAGNOSIS — L0211 Cutaneous abscess of neck: Secondary | ICD-10-CM

## 2022-05-25 DIAGNOSIS — R220 Localized swelling, mass and lump, head: Secondary | ICD-10-CM | POA: Diagnosis present

## 2022-05-25 DIAGNOSIS — R739 Hyperglycemia, unspecified: Secondary | ICD-10-CM | POA: Diagnosis present

## 2022-05-25 DIAGNOSIS — M109 Gout, unspecified: Secondary | ICD-10-CM

## 2022-05-25 DIAGNOSIS — I10 Essential (primary) hypertension: Secondary | ICD-10-CM

## 2022-05-25 HISTORY — DX: Fatty (change of) liver, not elsewhere classified: K76.0

## 2022-05-25 HISTORY — DX: Gout, unspecified: M10.9

## 2022-05-25 HISTORY — DX: Essential (primary) hypertension: I10

## 2022-05-25 LAB — CBC
HCT: 41.7 % (ref 39.0–52.0)
Hemoglobin: 13.9 g/dL (ref 13.0–17.0)
MCH: 29.4 pg (ref 26.0–34.0)
MCHC: 33.3 g/dL (ref 30.0–36.0)
MCV: 88.2 fL (ref 80.0–100.0)
Platelets: 358 10*3/uL (ref 150–400)
RBC: 4.73 MIL/uL (ref 4.22–5.81)
RDW: 15.9 % — ABNORMAL HIGH (ref 11.5–15.5)
WBC: 15.6 10*3/uL — ABNORMAL HIGH (ref 4.0–10.5)
nRBC: 0 % (ref 0.0–0.2)

## 2022-05-25 LAB — ECHOCARDIOGRAM COMPLETE
AR max vel: 2.74 cm2
AV Area VTI: 2.68 cm2
AV Area mean vel: 2.73 cm2
AV Mean grad: 8 mmHg
AV Peak grad: 13.2 mmHg
Ao pk vel: 1.82 m/s
Area-P 1/2: 14.31 cm2
Height: 70 in
S' Lateral: 2.9 cm
Weight: 4144.65 oz

## 2022-05-25 LAB — HEMOGLOBIN A1C
Hgb A1c MFr Bld: 5.3 % (ref 4.8–5.6)
Mean Plasma Glucose: 105.41 mg/dL

## 2022-05-25 LAB — GLUCOSE, CAPILLARY
Glucose-Capillary: 132 mg/dL — ABNORMAL HIGH (ref 70–99)
Glucose-Capillary: 179 mg/dL — ABNORMAL HIGH (ref 70–99)
Glucose-Capillary: 108 mg/dL — ABNORMAL HIGH (ref 70–99)
Glucose-Capillary: 100 mg/dL — ABNORMAL HIGH (ref 70–99)

## 2022-05-25 LAB — CBG MONITORING, ED
Glucose-Capillary: 127 mg/dL — ABNORMAL HIGH (ref 70–99)
Glucose-Capillary: 149 mg/dL — ABNORMAL HIGH (ref 70–99)

## 2022-05-25 LAB — COMPREHENSIVE METABOLIC PANEL
ALT: 21 U/L (ref 0–44)
AST: 17 U/L (ref 15–41)
Albumin: 3.3 g/dL — ABNORMAL LOW (ref 3.5–5.0)
Alkaline Phosphatase: 53 U/L (ref 38–126)
Anion gap: 10 (ref 5–15)
BUN: 12 mg/dL (ref 8–23)
CO2: 26 mmol/L (ref 22–32)
Calcium: 8.2 mg/dL — ABNORMAL LOW (ref 8.9–10.3)
Chloride: 102 mmol/L (ref 98–111)
Creatinine, Ser: 0.75 mg/dL (ref 0.61–1.24)
GFR, Estimated: >60 mL/min (ref >60)
Glucose, Bld: 133 mg/dL — ABNORMAL HIGH (ref 70–99)
Potassium: 3 mmol/L — ABNORMAL LOW (ref 3.5–5.1)
Sodium: 138 mmol/L (ref 135–145)
Total Bilirubin: 1.5 mg/dL — ABNORMAL HIGH (ref 0.3–1.2)
Total Protein: 7.9 g/dL (ref 6.5–8.1)

## 2022-05-25 LAB — MRSA NEXT GEN BY PCR, NASAL: MRSA by PCR Next Gen: NOT DETECTED

## 2022-05-25 LAB — TROPONIN I (HIGH SENSITIVITY)
Troponin I (High Sensitivity): 16 ng/L (ref <18)
Troponin I (High Sensitivity): 15 ng/L (ref <18)

## 2022-05-25 LAB — HIV ANTIBODY (ROUTINE TESTING W REFLEX): HIV Screen 4th Generation wRfx: NONREACTIVE

## 2022-05-25 LAB — PHOSPHORUS: Phosphorus: 3.2 mg/dL (ref 2.5–4.6)

## 2022-05-25 LAB — MAGNESIUM: Magnesium: 2.1 mg/dL (ref 1.7–2.4)

## 2022-05-25 MED ORDER — ORAL CARE MOUTH RINSE
15.0000 mL | Freq: Two times a day (BID) | OROMUCOSAL | Status: DC
  Administered 2022-05-25: 15 mL via OROMUCOSAL

## 2022-05-25 MED ORDER — METRONIDAZOLE 500 MG/100ML IV SOLN
500.0000 mg | Freq: Two times a day (BID) | INTRAVENOUS | Status: DC
  Administered 2022-05-25: 500 mg via INTRAVENOUS
  Filled 2022-05-25: qty 100

## 2022-05-25 MED ORDER — HYDRALAZINE HCL 25 MG PO TABS
25.0000 mg | ORAL_TABLET | Freq: Three times a day (TID) | ORAL | Status: DC
Start: 2022-05-25 — End: 2022-05-25
  Administered 2022-05-25: 25 mg via ORAL
  Filled 2022-05-25: qty 1

## 2022-05-25 MED ORDER — DOCUSATE SODIUM 100 MG PO CAPS: 100.0000 mg | ORAL_CAPSULE | Freq: Two times a day (BID) | ORAL | Status: DC | PRN

## 2022-05-25 MED ORDER — POLYETHYLENE GLYCOL 3350 17 G PO PACK: 17.0000 g | PACK | Freq: Every day | ORAL | Status: DC | PRN

## 2022-05-25 MED ORDER — ACETAMINOPHEN 650 MG RE SUPP: 650.0000 mg | Freq: Four times a day (QID) | RECTAL | Status: DC | PRN

## 2022-05-25 MED ORDER — OXYCODONE HCL 5 MG PO TABS: 5.0000 mg | ORAL_TABLET | ORAL | Status: DC | PRN

## 2022-05-25 MED ORDER — INSULIN ASPART 100 UNIT/ML IJ SOLN
0.0000 [IU] | Freq: Three times a day (TID) | INTRAMUSCULAR | Status: DC
  Administered 2022-05-25: 2 [IU] via SUBCUTANEOUS

## 2022-05-25 MED ORDER — ALBUTEROL SULFATE (2.5 MG/3ML) 0.083% IN NEBU: 2.5000 mg | INHALATION_SOLUTION | RESPIRATORY_TRACT | Status: DC | PRN

## 2022-05-25 MED ORDER — SODIUM CHLORIDE 0.9 % IV SOLN: INTRAVENOUS | Status: AC

## 2022-05-25 MED ORDER — VANCOMYCIN HCL IN DEXTROSE 1-5 GM/200ML-% IV SOLN
1000.0000 mg | Freq: Two times a day (BID) | INTRAVENOUS | Status: DC
Start: 2022-05-25 — End: 2022-05-25

## 2022-05-25 MED ORDER — FENTANYL CITRATE PF 50 MCG/ML IJ SOSY: 50.0000 ug | PREFILLED_SYRINGE | INTRAMUSCULAR | Status: DC | PRN

## 2022-05-25 MED ORDER — HYDRALAZINE HCL 20 MG/ML IJ SOLN
10.0000 mg | INTRAMUSCULAR | Status: DC | PRN
Start: 2022-05-25 — End: 2022-05-26
  Administered 2022-05-25 (×3): 10 mg via INTRAVENOUS
  Filled 2022-05-25 (×4): qty 1

## 2022-05-25 MED ORDER — FAMOTIDINE IN NACL 20-0.9 MG/50ML-% IV SOLN
20.0000 mg | Freq: Two times a day (BID) | INTRAVENOUS | Status: DC
Start: 2022-05-25 — End: 2022-05-26
  Administered 2022-05-25 (×3): 20 mg via INTRAVENOUS
  Filled 2022-05-25 (×3): qty 50

## 2022-05-25 MED ORDER — HYDRALAZINE HCL 50 MG PO TABS
50.0000 mg | ORAL_TABLET | Freq: Three times a day (TID) | ORAL | Status: DC
  Administered 2022-05-25 (×2): 50 mg via ORAL
  Filled 2022-05-25 (×2): qty 1

## 2022-05-25 MED ORDER — GUAIFENESIN 100 MG/5ML PO LIQD: 5.0000 mL | ORAL | Status: DC | PRN

## 2022-05-25 MED ORDER — DIPHENHYDRAMINE HCL 50 MG/ML IJ SOLN: 12.5000 mg | Freq: Four times a day (QID) | INTRAMUSCULAR | Status: DC | PRN

## 2022-05-25 MED ORDER — VANCOMYCIN HCL 2000 MG/400ML IV SOLN
2000.0000 mg | INTRAVENOUS | Status: AC
  Administered 2022-05-25: 2000 mg via INTRAVENOUS
  Filled 2022-05-25: qty 400

## 2022-05-25 MED ORDER — SODIUM CHLORIDE 0.9 % IV SOLN: INTRAVENOUS | Status: DC | PRN

## 2022-05-25 MED ORDER — LACTATED RINGERS IV SOLN: INTRAVENOUS | Status: DC

## 2022-05-25 MED ORDER — METHYLPREDNISOLONE SODIUM SUCC 40 MG IJ SOLR
40.0000 mg | Freq: Every day | INTRAMUSCULAR | Status: DC
Start: 2022-05-26 — End: 2022-05-26

## 2022-05-25 MED ORDER — HEPARIN SODIUM (PORCINE) 5000 UNIT/ML IJ SOLN
5000.0000 [IU] | Freq: Three times a day (TID) | INTRAMUSCULAR | Status: DC
Start: 2022-05-25 — End: 2022-05-26
  Administered 2022-05-25 – 2022-05-26 (×3): 5000 [IU] via SUBCUTANEOUS
  Filled 2022-05-25 (×3): qty 1

## 2022-05-25 MED ORDER — METOPROLOL TARTRATE 5 MG/5ML IV SOLN: 5.0000 mg | INTRAVENOUS | Status: DC | PRN

## 2022-05-25 MED ORDER — CHLORHEXIDINE GLUCONATE CLOTH 2 % EX PADS: 6.0000 | MEDICATED_PAD | Freq: Every day | CUTANEOUS | Status: DC

## 2022-05-25 MED ORDER — SODIUM CHLORIDE 0.9 % IV SOLN
3.0000 g | Freq: Four times a day (QID) | INTRAVENOUS | Status: DC
  Administered 2022-05-25 – 2022-05-26 (×4): 3 g via INTRAVENOUS
  Filled 2022-05-25 (×5): qty 8

## 2022-05-25 MED ORDER — ACETAMINOPHEN 325 MG PO TABS: 650.0000 mg | ORAL_TABLET | Freq: Four times a day (QID) | ORAL | Status: DC | PRN

## 2022-05-25 MED ORDER — POTASSIUM CHLORIDE 20 MEQ PO PACK
40.0000 meq | PACK | ORAL | Status: AC
  Administered 2022-05-25 (×2): 40 meq via ORAL
  Filled 2022-05-25 (×2): qty 2

## 2022-05-25 MED ORDER — HYDRALAZINE HCL 25 MG PO TABS
25.0000 mg | ORAL_TABLET | Freq: Once | ORAL | Status: AC
  Administered 2022-05-25: 25 mg via ORAL
  Filled 2022-05-25: qty 1

## 2022-05-25 NOTE — Progress Notes (Signed)
eLink Physician-Brief Progress Note Patient Name: Todd Rasmussen DOB: 02-12-1951 MRN: [id number]   Date of Service  05/25/2022  HPI/Events of Note  Hypertension - BP = 186/90.  eICU Interventions  Plan: Hydralazine 10 mg IV Q 4 hours PRN SBP > 160 or DBP > 100.     Intervention Category Major Interventions: Hypertension - evaluation and management  [name] 05/25/2022, 6:19 AM

## 2022-05-25 NOTE — Progress Notes (Signed)
Pharmacy Antibiotic Note  Albert Hersch is a 71 y.o. male admitted on 05/24/2022 with sepsis with submandibular gland infection.  Pharmacy has been consulted for Vancomycin dosing along with Cipro dosing previously.  Plan: - Vancomycin 2000mg  IV x 1 followed by Vancomycin 1000 mg IV Q 12 hrs. Goal AUC 400-550.  Expected AUC: 473.7  SCr used: 0.86 - Continue Cipro 400mg  IV q12h as previously ordered - Metronidazole 500mg  IV q12h per MD - follow renal function   Height: 5\' 10"  (177.8 cm) Weight: 114.3 kg (252 lb) IBW/kg (Calculated) : 73  Temp (24hrs), Avg:98.8 F (37.1 C), Min:98 F (36.7 C), Max:99.6 F (37.6 C)  Recent Labs  Lab 05/24/22 1754 05/24/22 2000 05/24/22 2233  WBC 14.1*  --   --   CREATININE 0.73  --  0.86  LATICACIDVEN 1.2 1.2  --     Estimated Creatinine Clearance: 102.6 mL/min (by C-G formula based on SCr of 0.86 mg/dL).    Allergies  Allergen Reactions   Losartan Swelling    Antimicrobials this admission: 06/08 Cipro >>   06/08 Metronidazole >>   06/08 Vancomycin >>  Dose adjustments this admission:    Microbiology results:    Thank you for allowing pharmacy to be a part of this patient's care.  07/24/22, PharmD 05/25/2022 1:48 AM

## 2022-05-25 NOTE — Progress Notes (Signed)
Echocardiogram 2D Echocardiogram has been performed.  Todd Rasmussen 05/25/2022, 12:30 PM

## 2022-05-25 NOTE — Plan of Care (Signed)
  Problem: Health Behavior/Discharge Planning: Goal: Ability to manage health-related needs will improve Outcome: Progressing   Problem: Clinical Measurements: Goal: Respiratory complications will improve Outcome: Progressing   Problem: Activity: Goal: Risk for activity intolerance will decrease Outcome: Progressing   

## 2022-05-25 NOTE — Discharge Instructions (Signed)
Low Purine diet for gout  Foods moderate in purines  Oatmeal (do not eat more than 2/3 cup uncooked, daily) Wheat bran, wheat germ (do not eat more than 1/4 cup dry, daily) Crab, lobster, oysters and shrimp (limit to 1-2 servings* daily) Dried beans, peas, and lentils (limit to 1 cup cooked daily) Asparagus, cauliflower, spinach, mushrooms, green peas (do not eat more than 1/2 cup of these vegetables daily)  Foods Not Recommended No foods must be completely avoided. However, you should limit foods that are high in purines.  Beer and other alcoholic beverages Gravies and sauces made with meat Anchovies, sardines, herring, mussels, tuna, codfish, scallops, trout, and haddock; bacon; organ meats (such as liver or kidney); tripe; sweetbreads; wild game; goose Yeast and yeast extracts (taken as supplements)  From [employer]

## 2022-05-25 NOTE — Progress Notes (Addendum)
Brief PCCM Note:  Admitted with submandibular swelling. Improved with abx, steroids. No airway concern. PCCM will sign off.

## 2022-05-25 NOTE — ED Notes (Signed)
Pt placed on 2 L/M Nasal cannula due to SpO2 readings RA were 90-92% Pt now reading 96%

## 2022-05-25 NOTE — H&P (Signed)
NAME:  Todd Rasmussen, MRN:  [id number], DOB:  1951/11/15, LOS: 1 ADMISSION DATE:  05/24/2022 CONSULTATION DATE:  05/25/2022 REFERRING MD:  Adela Glimpse - TRH CHIEF COMPLAINT:  Fever, facial swelling, c/f angioedema   History of Present Illness:  71 year old man who presented to [facility] ED from his PCP's office 6/8 for fever and facial swelling x 2 days. Swelling localized to R side of tongue, R jaw and R neck with trismus and associated dysphagia and dyspnea. Recently began taking Losartan  ~1 week PTA (previously on Norvasc, discontinued 2/2 leg swelling). PMHx significant for HTN, pericarditis (2002), fatty liver, gout, obesity, tobacco/EtOH use. No previous history of angioedema or allergies to medications.   On ED arrival, afebrile with HR 81, RR 17, BP 194/100, SpO2 96% on RA but toxic-appearing. Labs were notable for WBC 14.1 (77% N), K 5.9, normal LA. CXR negative. CT Neck demonstrated R facial inflammation likely 2/2 acute R submandibular sialadenitis with 30mm likely obstructing stone; no abscess/fluid collection noted. ENT (Dr.Teoh) consulted with recommendation for steroids and antibiotic initiation.  PCCM consulted for airway concerns due to increased secretions in the setting of facial/submandibular swelling.  Pertinent Medical History:  HTN, pericarditis (2002), fatty liver, gout, obesity, tobacco/EtOH use  Significant Hospital Events: Including procedures, antibiotic start and stop dates in addition to other pertinent events   6/8 - Presented to PCP office with R facial/neck swelling and fever x 2 days. Febrile to Tmax 103F. Sent to ED emergently for airway concerns. CT Neck with c/f R submandibular sialadenitis with obstructing stone. ENT consult with recs for conservative management. PCCM consulted for airway concerns.  Interim History / Subjective:  PCCM consulted with concern for airway protection in the setting of increased secretions  Objective:  Blood pressure (!) 166/85, pulse 81,  temperature 98 F (36.7 C), temperature source Oral, resp. rate 19, height 5\' 10"  (1.778 m), weight 114.3 kg, SpO2 93 %.        Intake/Output Summary (Last 24 hours) at 05/25/2022 0156 Last data filed at 05/25/2022 0156 Gross per 24 hour  Intake 100 ml  Output --  Net 100 ml   Filed Weights   05/25/22 0124  Weight: 114.3 kg    Physical Examination: General: middle aged man lying in bed in NAD HEENT: [place]/AT, eyes anicteric, only able to open mouth about 1.5cm, but moist mucus membranes  Neck: mild asymmetric swelling of R submandibular and upper neck, but not warm or indurated. No obvious masses. Neuro: awake and alert, moving all extremities. Comprehension appears intact, can speak normally, but has pain with speaking so doing it minimally.  CV: S1S2, RRR PULM: breathing comfortably on RA, CTAB, no stridor. No difficulty managing oral secretions. GI: soft, NT Extremities: no cyanosis or significant edema Skin: warm, dry, no rashes or erythema  Na+ 131 K+ 3.0 BG 399 BUN 10 Cr 0.86 T bili 1.4 LA 1.2 WBC 14.1  CT neck personally reviewed> R sided soft tissue swelling with large, contrast enhancing submandibular gland. Minimal airway compression noted, but not grossly displaced. Distal stone obstructing in submandibular duct.  Resolved Hospital Problem List:    Assessment & Plan:   R facial/neck swelling likely secondary to submandibular sialadenitis Cannot complete rule out angioedema, but this seems less likely based on radiographic findings with fever and leukocytosis. Presented to PCP office with R facial/neck swelling and fever x 2 days. Febrile to Tmax 103F. Sent to ED emergently for airway concerns. CT Neck with c/f R submandibular sialadenitis with obstructing  stone. - Admit to ICU for close monitoring/airway concerns. No indication currently that his airway is threatened. He has responded to treatments so far in the ED. - Low threshold to intubate if swelling worsens,  he is unable to manage secretions, or becomes hypoxic - ENT consulted, appreciate recommendations. Will see in the AM. - Conservative management for now - Ciprofloxacin/Flagyl, add vanc for gram+ coverage - S/p Decadron x 1, continue Solumedrol daily - Pepcid BID and Benadryl PRN added in case this is angioedema, fine to continue for now  Hypertension Home medication: Losartan started ~ 6/1. Previously on Norvasc, discontinued due to LE swelling. - Hold home medication for now in the setting of angioedema ruleout - Hydralazine PRN for SBP > 180 - Cardiac monitoring/tele  Elevated transaminases and hyperbilirubinemia due to sepsis -monitor  Fatty liver Obesity Noted on US Abdomen 01/2022. - Trend LFTs - Encourage EtOH cessation, dietary changes  Gout - Hold allopurinol while undergoing further workup  Hyperglycemia, likely reactive from steroids -check A1c -SSI PRN  Hyponatremia and hypokalemia-- wonder if these were drawn upstream from an IV medication -recheck on AM labs  Best Practice: (right click and "Reselect all SmartList Selections" daily)   Diet/type: NPO DVT prophylaxis: prophylactic heparin  GI prophylaxis: H2B Lines: N/A Foley:  N/A Code Status:  full code Last date of multidisciplinary goals of care discussion [daughter updated at bedside in ED]  Labs:  CBC: Recent Labs  Lab 05/24/22 1754  WBC 14.1*  NEUTROABS 10.7*  HGB 14.9  HCT 45.0  MCV 88.2  PLT 369    Basic Metabolic Panel: Recent Labs  Lab 05/24/22 1754 05/24/22 2233  NA 139 131*  K 5.9* 3.0*  CL 102 96*  CO2 29 24  GLUCOSE 88 399*  BUN 11 10  CREATININE 0.73 0.86  CALCIUM 8.2* 7.9*  MG  --  1.9  PHOS  --  2.5   GFR: Estimated Creatinine Clearance: 102.6 mL/min (by C-G formula based on SCr of 0.86 mg/dL). Recent Labs  Lab 05/24/22 1754 05/24/22 2000  WBC 14.1*  --   LATICACIDVEN 1.2 1.2    Liver Function Tests: Recent Labs  Lab 05/24/22 1754 05/24/22 2233  AST 44*  19  ALT 24 21  ALKPHOS 56 48  BILITOT 2.8* 1.4*  PROT 8.5* 7.4  ALBUMIN 3.6 3.1*   No results for input(s): "LIPASE", "AMYLASE" in the last 168 hours. No results for input(s): "AMMONIA" in the last 168 hours.  ABG No results found for: "PHART", "PCO2ART", "PO2ART", "HCO3", "TCO2", "ACIDBASEDEF", "O2SAT"   Coagulation Profile: No results for input(s): "INR", "PROTIME" in the last 168 hours.  Cardiac Enzymes: Recent Labs  Lab 05/24/22 2233  CKTOTAL 77    HbA1C: No results found for: "HGBA1C"  CBG: Recent Labs  Lab 05/25/22 0009  GLUCAP 127*    Review of Systems:   Review of Systems  Constitutional:  Positive for fever.  HENT:  Positive for sore throat.   Eyes: Negative.   Respiratory: Negative.    Cardiovascular: Negative.   Gastrointestinal: Negative.   Genitourinary: Negative.   Musculoskeletal:  Positive for neck pain.     Past Medical History:  He,  has a past medical history of Fatty liver (05/25/2022), Gout (05/25/2022), and Hypertension (05/25/2022).   Surgical History:  History reviewed. No pertinent surgical history.   Social History:   reports that he has been smoking cigarettes. He has never used smokeless tobacco. He reports current alcohol use.   Family History:  His family history includes Diabetes in his mother.   Allergies: Allergies  Allergen Reactions   Losartan Swelling    Home Medications: Prior to Admission medications   Medication Sig Start Date End Date Taking? Authorizing Provider  allopurinol (ZYLOPRIM) 100 MG tablet Take 100 mg by mouth daily. 05/19/22  Yes [provider]  Multiple Vitamins-Minerals (MULTIVITAMIN MEN) TABS Take 1 tablet by mouth daily.   Yes [provider]   Critical care time: 45 min.   Steffanie Dunn, DO 05/25/22 1:56 AM [facility]

## 2022-05-25 NOTE — ED Notes (Signed)
Unsuccessful IV attempt in L Forearm, 22g.

## 2022-05-25 NOTE — Consult Note (Signed)
Reason for Consult: Acute submandibular sialoadenitis  HPI:  Todd Rasmussen is an 71 y.o. male who presented to the [facility] emergency room yesterday complaining of right neck and facial swelling.  His symptoms started 2 days ago.  He has noted difficulty swallowing, speaking, and breathing. He started losartan about 1 week ago, otherwise no recent medication change, diet change, activity change.  Currently no chest pain, no abdominal pain.  His CT scan showed right submandibular sialoadenitis.  No drainable abscess was noted.  He was admitted and treated with IV antibiotic and steroid, with significant improvement overnight.  Past Medical History:  Diagnosis Date   Fatty liver 05/25/2022   Noted on [place] 01/2022   Gout 05/25/2022   Hypertension 05/25/2022    History reviewed. No pertinent surgical history.  Family History  Problem Relation Age of Onset   Diabetes Mother     Social History:  reports that he has been smoking cigarettes. He has never used smokeless tobacco. He reports current alcohol use. No history on file for drug use.  Allergies:  Allergies  Allergen Reactions   Losartan Swelling    Prior to Admission medications   Medication Sig Start Date End Date Taking? Authorizing Provider  allopurinol (ZYLOPRIM) 100 MG tablet Take 100 mg by mouth daily. 05/19/22  Yes [provider]  Multiple Vitamins-Minerals (MULTIVITAMIN MEN) TABS Take 1 tablet by mouth daily.   Yes [provider]    Medications: I have reviewed the patient's current medications. Scheduled:  Chlorhexidine Gluconate Cloth  6 each Topical Daily   heparin  5,000 Units Subcutaneous Q8H   insulin aspart  0-9 Units Subcutaneous Q4H   mouth rinse  15 mL Mouth Rinse BID   [START ON 05/26/2022] methylPREDNISolone (SOLU-MEDROL) injection  40 mg Intravenous Daily   Continuous:  sodium chloride     ciprofloxacin     famotidine (PEPCID) IV Stopped (05/25/22 0156)   lactated ringers 75 mL/hr at  05/25/22 [id number]   metronidazole     vancomycin     [id number] chloride, acetaminophen **OR** acetaminophen, albuterol, diphenhydrAMINE, docusate sodium, fentaNYL (SUBLIMAZE) injection, hydrALAZINE, polyethylene glycol  Results for orders placed or performed during the hospital encounter of 05/24/22 (from the past 48 hour(s))  Comprehensive metabolic panel     Status: Abnormal   Collection Time: 05/24/22  5:54 PM  Result Value Ref Range   Sodium 139 135 - 145 mmol/L   Potassium 5.9 (H) 3.5 - 5.1 mmol/L   Chloride 102 98 - 111 mmol/L   CO2 29 22 - 32 mmol/L   Glucose, Bld 88 70 - 99 mg/dL    Comment: Glucose reference range applies only to samples taken after fasting for at least 8 hours.   BUN 11 8 - 23 mg/dL   Creatinine, Ser 0.73 0.61 - 1.24 mg/dL   Calcium 8.2 (L) 8.9 - 10.3 mg/dL   Total Protein 8.5 (H) 6.5 - 8.1 g/dL   Albumin 3.6 3.5 - 5.0 g/dL   AST 44 (H) 15 - 41 U/L   ALT 24 0 - 44 U/L   Alkaline Phosphatase 56 38 - 126 U/L   Total Bilirubin 2.8 (H) 0.3 - 1.2 mg/dL   GFR, Estimated >60 >60 mL/min    Comment: (NOTE) Calculated using the CKD-EPI Creatinine Equation (2021)    Anion gap 8 5 - 15    Comment: Performed at [facility], [place] [address], [place], [place] [address]  CBC with Differential  Status: Abnormal   Collection Time: 05/24/22  5:54 PM  Result Value Ref Range   WBC 14.1 (H) 4.0 - 10.5 K/uL   RBC 5.10 4.22 - 5.81 MIL/uL   Hemoglobin 14.9 13.0 - 17.0 g/dL   HCT 45.0 39.0 - 52.0 %   MCV 88.2 80.0 - 100.0 fL   MCH 29.2 26.0 - 34.0 pg   MCHC 33.1 30.0 - 36.0 g/dL   RDW 16.0 (H) 11.5 - 15.5 %   Platelets 369 150 - 400 K/uL   nRBC 0.0 0.0 - 0.2 %   Neutrophils Relative % 77 %   Neutro Abs 10.7 (H) 1.7 - 7.7 K/uL   Lymphocytes Relative 15 %   Lymphs Abs 2.2 0.7 - 4.0 K/uL   Monocytes Relative 7 %   Monocytes Absolute 1.0 0.1 - 1.0 K/uL   Eosinophils Relative 1 %   Eosinophils Absolute 0.2 0.0 - 0.5 K/uL   Basophils Relative 0 %    Basophils Absolute 0.1 0.0 - 0.1 K/uL   Immature Granulocytes 0 %   Abs Immature Granulocytes 0.05 0.00 - 0.07 K/uL    Comment: Performed at [facility], [place] [address] [place], [place] [address]  Lactic acid, plasma     Status: None   Collection Time: 05/24/22  5:54 PM  Result Value Ref Range   Lactic Acid, Venous 1.2 0.5 - 1.9 mmol/L    Comment: Performed at [facility], [place] [address] [place], [place] [address]  Hemoglobin A1c     Status: None   Collection Time: 05/24/22  5:54 PM  Result Value Ref Range   Hgb A1c MFr Bld 5.3 4.8 - 5.6 %    Comment: (NOTE) Pre diabetes:          5.7%-6.4%  Diabetes:              >6.4%  Glycemic control for   <7.0% adults with diabetes    Mean Plasma Glucose 105.41 mg/dL    Comment: Performed at [place] [address] [place], [place] [address]  Lactic acid, plasma     Status: None   Collection Time: 05/24/22  8:00 PM  Result Value Ref Range   Lactic Acid, Venous 1.2 0.5 - 1.9 mmol/L    Comment: Performed at [facility], [place] [address], [place], [place] [id number]  Basic metabolic panel     Status: Abnormal   Collection Time: 05/24/22 10:33 PM  Result Value Ref Range   Sodium 131 (L) 135 - 145 mmol/L    Comment: DELTA CHECK NOTED   Potassium 3.0 (L) 3.5 - 5.1 mmol/L    Comment: DELTA CHECK NOTED   Chloride 96 (L) 98 - 111 mmol/L   CO2 24 22 - 32 mmol/L   Glucose, Bld 399 (H) 70 - 99 mg/dL    Comment: Glucose reference range applies only to samples taken after fasting for at least 8 hours.   BUN 10 8 - 23 mg/dL   Creatinine, Ser 0.86 0.61 - 1.24 mg/dL   Calcium 7.9 (L) 8.9 - 10.3 mg/dL   GFR, Estimated >60 >60 mL/min    Comment: (NOTE) Calculated using the CKD-EPI Creatinine Equation (2021)    Anion gap 11 5 - 15    Comment: Performed at [facility], [place] [address] [place], [place] [address]  CK     Status: None   Collection Time: 05/24/22  10:33 PM  Result Value Ref  Range   Total CK 77 49 - 397 U/L    Comment: Performed at [facility], [place] [address], [place], [place] [address]  Hepatic function panel     Status: Abnormal   Collection Time: 05/24/22 10:33 PM  Result Value Ref Range   Total Protein 7.4 6.5 - 8.1 g/dL   Albumin 3.1 (L) 3.5 - 5.0 g/dL   AST 19 15 - 41 U/L   ALT 21 0 - 44 U/L   Alkaline Phosphatase 48 38 - 126 U/L   Total Bilirubin 1.4 (H) 0.3 - 1.2 mg/dL   Bilirubin, Direct 0.3 (H) 0.0 - 0.2 mg/dL   Indirect Bilirubin 1.1 (H) 0.3 - 0.9 mg/dL    Comment: Performed at [facility], [place] [address] [place], [place] [address]  Magnesium     Status: None   Collection Time: 05/24/22 10:33 PM  Result Value Ref Range   Magnesium 1.9 1.7 - 2.4 mg/dL    Comment: Performed at [facility], [place] [address], [place], [place] [address]  Phosphorus     Status: None   Collection Time: 05/24/22 10:33 PM  Result Value Ref Range   Phosphorus 2.5 2.5 - 4.6 mg/dL    Comment: Performed at [facility], [place] [address], [place], [place] [address]  Uric acid     Status: None   Collection Time: 05/24/22 11:00 PM  Result Value Ref Range   Uric Acid, Serum 6.1 3.7 - 8.6 mg/dL    Comment: Performed at [facility], [place] [address], [place], [place] [address]  Lactate dehydrogenase     Status: None   Collection Time: 05/24/22 11:00 PM  Result Value Ref Range   LDH 170 98 - 192 U/L    Comment: Performed at [facility], [place] [address] [place], [place] [address]  CBG monitoring, ED     Status: Abnormal   Collection Time: 05/25/22 12:09 AM  Result Value Ref Range   Glucose-Capillary 127 (H) 70 - 99 mg/dL    Comment: Glucose reference range applies only to samples taken after fasting for at least 8 hours.  Troponin I (High Sensitivity)     Status: None   Collection Time: 05/25/22  1:55 AM  Result Value Ref Range    Troponin I (High Sensitivity) 16 <18 ng/L    Comment: (NOTE) Elevated high sensitivity troponin I (hsTnI) values and significant  changes across serial measurements may suggest ACS but many other  chronic and acute conditions are known to elevate hsTnI results.  Refer to the "Links" section for chest pain algorithms and additional  guidance. Performed at [facility], [place] [address] [place], [place] [address]   Troponin I (High Sensitivity)     Status: None   Collection Time: 05/25/22  2:52 AM  Result Value Ref Range   Troponin I (High Sensitivity) 15 <18 ng/L    Comment: (NOTE) Elevated high sensitivity troponin I (hsTnI) values and significant  changes across serial measurements may suggest ACS but many other  chronic and acute conditions are known to elevate hsTnI results.  Refer to the "Links" section for chest pain algorithms and additional  guidance. Performed at [facility], [place] [address], [place], [place] [address]   MRSA Next Gen by PCR, Nasal     Status: None   Collection Time: 05/25/22  3:58 AM   Specimen: Nasal Mucosa; Nasal Swab  Result Value Ref  Range   MRSA by PCR Next Gen NOT DETECTED NOT DETECTED    Comment: (NOTE) The GeneXpert MRSA Assay (FDA approved for NASAL specimens only), is one component of a comprehensive MRSA colonization surveillance program. It is not intended to diagnose MRSA infection nor to guide or monitor treatment for MRSA infections. Test performance is not FDA approved in patients less than 1 years old. Performed at [facility], [place] [address], [place], [place] [address]   CBG monitoring, ED     Status: Abnormal   Collection Time: 05/25/22  4:53 AM  Result Value Ref Range   Glucose-Capillary 149 (H) 70 - 99 mg/dL    Comment: Glucose reference range applies only to samples taken after fasting for at least 8 hours.  CBC     Status: Abnormal   Collection Time: 05/25/22  6:22 AM   Result Value Ref Range   WBC 15.6 (H) 4.0 - 10.5 K/uL   RBC 4.73 4.22 - 5.81 MIL/uL   Hemoglobin 13.9 13.0 - 17.0 g/dL   HCT 41.7 39.0 - 52.0 %   MCV 88.2 80.0 - 100.0 fL   MCH 29.4 26.0 - 34.0 pg   MCHC 33.3 30.0 - 36.0 g/dL   RDW 15.9 (H) 11.5 - 15.5 %   Platelets 358 150 - 400 K/uL   nRBC 0.0 0.0 - 0.2 %    Comment: Performed at [facility], [place] [address] [place], [place] [address]    DG CHEST PORT 1 VIEW  Result Date: 05/24/2022 CLINICAL DATA:  Angioedema and fever.  Swelling under neck. EXAM: PORTABLE CHEST 1 VIEW COMPARISON:  None Available. FINDINGS: The heart size and mediastinal contours are within normal limits. Lung volumes are low. No consolidation, effusion, or pneumothorax. No acute osseous abnormality. Prominent soft tissue is noted in the cervical region which may be associated with patient's history of swelling. IMPRESSION: No acute cardiopulmonary process. Electronically Signed   By: Brett Fairy M.D.   On: 05/24/2022 23:38   CT Soft Tissue Neck W Contrast  Result Date: 05/24/2022 CLINICAL DATA:  Right-sided edema with tenderness to palpation EXAM: CT NECK WITH CONTRAST TECHNIQUE: Multidetector CT imaging of the neck was performed using the standard protocol following the bolus administration of intravenous contrast. RADIATION DOSE REDUCTION: This exam was performed according to the departmental dose-optimization program which includes automated exposure control, adjustment of the mA and/or kV according to patient size and/or use of iterative reconstruction technique. CONTRAST:  5mL OMNIPAQUE IOHEXOL 300 MG/ML  SOLN COMPARISON:  None Available. FINDINGS: Pharynx and larynx: The tonsils are unremarkable. There is no retropharyngeal or peritonsillar fluid collection. Much of the oral cavity is obscured by streak artifact dental amalgam. The right half of the vallecula is effaced due to edema within right parapharyngeal soft tissues. Salivary glands: Right  submandibular gland is enlarged and hyperenhancing. There is a 3 mm calcification near the orifice of the right submandibular duct (series 3, image 49). There is marked edema surrounding the right submandibular gland with thickening of the right platysma. Thyroid: Normal Lymph nodes: There are bilateral reactive subcentimeter lymph nodes. Vascular: Negative. Limited intracranial: Negative. Visualized orbits: Negative. Mastoids and visualized paranasal sinuses: Clear. Skeleton: No acute or aggressive process. Upper chest: Negative. Other: None. IMPRESSION: 1. Right facial inflammation likely due to acute right submandibular sialadenitis with 3 mm calcification near the orifice of the right submandibular duct, likely an obstructing stone. 2. No abscess or drainable fluid collection. 3. Reactive lymphadenopathy. Electronically  Signed   By: Ulyses Jarred M.D.   On: 05/24/2022 19:45    Review of Systems  All other systems reviewed and are negative.  Blood pressure (!) 188/92, pulse 87, temperature 98.5 F (36.9 C), temperature source Oral, resp. rate (!) 22, height 5\' 10"  (1.778 m), weight 117.5 kg, SpO2 95 %. General appearance: alert and cooperative Head: Normocephalic, without obvious abnormality, atraumatic Eyes: Pupils are equal, round, reactive to light. Extraocular motion is intact.  Ears: Examination of the ears shows normal auricles and external auditory canals bilaterally.  Nose: Nasal examination shows normal mucosa, septum, turbinates.  Face: Facial examination shows no asymmetry. Palpation of the face elicit no significant tenderness.  Mouth: Oral cavity examination shows floor of mouth induration.  No significant trismus. Neck: Palpation of the neck reveals an enlarged right submandibular gland.  The trachea is midline. The thyroid is not significantly enlarged.  Neuro: Cranial nerves 2-12 are all grossly in tact.   Assessment/Plan: Acute right submandibular sialoadenitis.  He also has a  small 3 mm submandibular stone. -Clinically improved with IV antibiotic and steroid overnight. -The patient is instructed to perform the following when symptomatic: Hydration, sialagogues, manual massage, and warm compresses. -The patient may be discharged with oral antibiotics (e.g. Augmentin 875 mg p.o. twice daily for 10 days). -The patient may follow-up with me as an outpatient.  Akaila Rambo W Nakiesha Rumsey 05/25/2022, 7:06 AM

## 2022-05-25 NOTE — Assessment & Plan Note (Addendum)
uncler if true hyperglycemia cbg 127 Will repeat labs

## 2022-05-25 NOTE — Progress Notes (Signed)
  Transition of Care [facility]) Screening Note   Patient Details  Name: Todd Rasmussen Date of Birth: 1951-09-05   Transition of Care [facility]) CM/SW Contact:    Lanier Clam, RN Phone Number: 05/25/2022, 9:33 AM    Transition of Care Department [facility]) has reviewed patient and no TOC needs have been identified at this time. We will continue to monitor patient advancement through interdisciplinary progression rounds. If new patient transition needs arise, please place a TOC consult.

## 2022-05-25 NOTE — Assessment & Plan Note (Addendum)
No prior ECG No CP Obtain  trop and ECHO Not meeting STEMI criteria

## 2022-05-25 NOTE — Progress Notes (Signed)
Initial Nutrition Assessment  DOCUMENTATION CODES:   Obesity unspecified  INTERVENTION:   -Placed "Low Purine" diet information in AVS  -Encouraged PO intakes  NUTRITION DIAGNOSIS:   Inadequate oral intake related to acute illness as evidenced by per patient/family report.   GOAL:   Patient will meet greater than or equal to 90% of their needs  MONITOR:   PO intake, Labs, Weight trends, I & O's  REASON FOR ASSESSMENT:   Malnutrition Screening Tool    ASSESSMENT:   71 year old with history of HTN, pericarditis in 2002, gout, fatty liver, alcohol and tobacco use, obesity came to the ED from PCPs office on 6/8 for fever and facial swelling for 2 days.  Patient just transferred from 2W. Just received a lunch tray with 2 sandwiches and an orange sherbet. States he is able to eat much better now. His face and mouth were so swollen 2 days ago he states he could get nothing into his mouth. He first noticed swelling months ago but this did not impact his eating until a week ago. Pt reports wanting to lose weight and knows what lifestyle changes he needs to make.  RD to provide list of high purine foods to avoid to help with his gout.  Reports weight loss but this is desired by the patient.  Medications: KLOR-CON, Pepcid  Labs reviewed:  CBGs:132-179 Low K  NUTRITION - FOCUSED PHYSICAL EXAM:  No depletions noted.  Diet Order:   Diet Order             DIET SOFT Room service appropriate? Yes; Fluid consistency: Thin  Diet effective now                   EDUCATION NEEDS:   Education needs have been addressed  Skin:  Skin Assessment: Reviewed RN Assessment  Last BM:  6/7  Height:   Ht Readings from Last 1 Encounters:  05/25/22 5\' 10"  (1.778 m)    Weight:   Wt Readings from Last 1 Encounters:  05/25/22 117.5 kg    BMI:  Body mass index is 37.17 kg/m.  Estimated Nutritional Needs:   Kcal:  1800-2000  Protein:  90-100g  Fluid:   2L/day  Clayton Bibles, MS, RD, LDN Inpatient Clinical Dietitian Contact information available via Amion

## 2022-05-25 NOTE — Progress Notes (Signed)
E-Link called re: elevated BP and not having PRNs available.   Also orders in place for new set of labs to be obtained and results to be reevaluated at that time.

## 2022-05-25 NOTE — Progress Notes (Signed)
PROGRESS NOTE    Todd Rasmussen  [id number] DOB: October 31, 1951 DOA: 05/24/2022 PCP: Shon Baton, MD   Brief Narrative:  71 year old with history of HTN, pericarditis in 2002, gout, fatty liver, alcohol and tobacco use, obesity came to the ED from PCPs office on 6/8 for fever and facial swelling for 2 days.  There was concerns of localized swelling on the right side of the face, with dyspnea and dysphagia.  He started taking losartan about a week ago, previously was on Norvasc.  CT showed acute right-sided submandibular sialadenitis with 3 mm obstructing stone.  Patient was started on antibiotics and steroids.  ENT was consulted.  Admitted to the ICU for closer airway monitoring.   Assessment & Plan:  Principal Problem:   Neck abscess Active Problems:   Acute bacterial sialadenitis   Hyperkalemia   Acute respiratory failure with hypoxia (HCC)   Angioedema   Hyperglycemia   Abnormal ECG   Hypertension   Gout   Fatty liver   Right facial swelling   Sepsis secondary to acute right-sided submandibular sialadenitis  -Sepsis present on admission given fever, leukocytosis.  Sepsis physiology is improving.  Admitted to the ICU for closer monitoring.  Patient has been seen by ENT who recommended steroids and antibiotics - Currently on broad-spectrum antibiotics-IV vancomycin, Cipro and Flagyl.  Once consistently able to take oral we can transition to p.o. Augmentin - Continue Solu-Medrol daily, Pepcid and as needed Benadryl - Diet as tolerated. - Chest x-ray-negative  Essential hypertension - Previously on losartan prior to admission and recently his Norvasc was discontinued.  Have started on hydralazine PO TID - IV as needed hydralazine and Lopressor ordered  Hyponatremia/hypokalemia - replete prn  Transaminitis with elevated total bilirubin Fatty liver disease - Right upper quadrant ultrasound 2/23-fatty liver, renal cyst - Trend LFTs-a.m. labs pending  Gout - Holding  allopurinol  Hyperglycemia, likely from steroid use - A1c-5.3 - Sliding scale      DVT prophylaxis: Subcu heparin Code Status: Full code Family Communication: Daughter is present at bedside  Status is: Inpatient Remains inpatient appropriate because: Maintain hospital stay as we slowly advance diet and make sure he tolerates p.o. without any issues.     Subjective: Patient seen and examined at bedside, does not any complaints.  His Norvasc was discontinued initially due to lower extremity swelling and started on losartan about a week prior to admission. Denies any recent URI type of symptoms as well. Feels like his tongue is still slightly swollen but significantly better compared to yesterday   Examination:  General exam: Appears calm and comfortable.  Right-sided submandibular discomfort and swelling noted. Respiratory system: Clear to auscultation. Respiratory effort normal. Cardiovascular system: S1 & S2 heard, RRR. No JVD, murmurs, rubs, gallops or clicks. No pedal edema. Gastrointestinal system: Abdomen is nondistended, soft and nontender. No organomegaly or masses felt. Normal bowel sounds heard. Central nervous system: Alert and oriented. No focal neurological deficits. Extremities: Symmetric 5 x 5 power. Skin: No rashes, lesions or ulcers Psychiatry: Judgement and insight appear normal. Mood & affect appropriate.     Objective: Vitals:   05/25/22 0535 05/25/22 0600 05/25/22 0641 05/25/22 0700  BP: (!) 186/90 (!) 179/78 (!) 188/92 (!) 189/94  Pulse: 87 85 87 88  Resp: 15 18 (!) 22 14  Temp:      TempSrc:      SpO2: 94% 96% 95% 95%  Weight:      Height:        Intake/Output Summary (Last  24 hours) at 05/25/2022 0721 Last data filed at 05/25/2022 [id number] Gross per 24 hour  Intake 816.61 ml  Output 400 ml  Net 416.61 ml   Filed Weights   05/25/22 0124 05/25/22 0530  Weight: 114.3 kg 117.5 kg     Data Reviewed:   CBC: Recent Labs  Lab 05/24/22 1754  05/25/22 0622  WBC 14.1* 15.6*  NEUTROABS 10.7*  --   HGB 14.9 13.9  HCT 45.0 41.7  MCV 88.2 88.2  PLT 369 [id number]   Basic Metabolic Panel: Recent Labs  Lab 05/24/22 1754 05/24/22 2233  NA 139 131*  K 5.9* 3.0*  CL 102 96*  CO2 29 24  GLUCOSE 88 399*  BUN 11 10  CREATININE 0.73 0.86  CALCIUM 8.2* 7.9*  MG  --  1.9  PHOS  --  2.5   GFR: Estimated Creatinine Clearance: 104.1 mL/min (by C-G formula based on SCr of 0.86 mg/dL). Liver Function Tests: Recent Labs  Lab 05/24/22 1754 05/24/22 2233  AST 44* 19  ALT 24 21  ALKPHOS 56 48  BILITOT 2.8* 1.4*  PROT 8.5* 7.4  ALBUMIN 3.6 3.1*   No results for input(s): "LIPASE", "AMYLASE" in the last 168 hours. No results for input(s): "AMMONIA" in the last 168 hours. Coagulation Profile: No results for input(s): "INR", "PROTIME" in the last 168 hours. Cardiac Enzymes: Recent Labs  Lab 05/24/22 2233  CKTOTAL 77   BNP (last 3 results) No results for input(s): "PROBNP" in the last 8760 hours. HbA1C: Recent Labs    05/24/22 1754  HGBA1C 5.3   CBG: Recent Labs  Lab 05/25/22 0009 05/25/22 0453  GLUCAP 127* 149*   Lipid Profile: No results for input(s): "CHOL", "HDL", "LDLCALC", "TRIG", "CHOLHDL", "LDLDIRECT" in the last 72 hours. Thyroid Function Tests: No results for input(s): "TSH", "T4TOTAL", "FREET4", "T3FREE", "THYROIDAB" in the last 72 hours. Anemia Panel: No results for input(s): "VITAMINB12", "FOLATE", "FERRITIN", "TIBC", "IRON", "RETICCTPCT" in the last 72 hours. Sepsis Labs: Recent Labs  Lab 05/24/22 1754 05/24/22 2000  LATICACIDVEN 1.2 1.2    Recent Results (from the past 240 hour(s))  MRSA Next Gen by PCR, Nasal     Status: None   Collection Time: 05/25/22  3:58 AM   Specimen: Nasal Mucosa; Nasal Swab  Result Value Ref Range Status   MRSA by PCR Next Gen NOT DETECTED NOT DETECTED Final    Comment: (NOTE) The GeneXpert MRSA Assay (FDA approved for NASAL specimens only), is one component of a  comprehensive MRSA colonization surveillance program. It is not intended to diagnose MRSA infection nor to guide or monitor treatment for MRSA infections. Test performance is not FDA approved in patients less than 75 years old. Performed at [facility], [place] [address], [place], [place] [address]          Radiology Studies: DG CHEST PORT 1 VIEW  Result Date: 05/24/2022 CLINICAL DATA:  Angioedema and fever.  Swelling under neck. EXAM: PORTABLE CHEST 1 VIEW COMPARISON:  None Available. FINDINGS: The heart size and mediastinal contours are within normal limits. Lung volumes are low. No consolidation, effusion, or pneumothorax. No acute osseous abnormality. Prominent soft tissue is noted in the cervical region which may be associated with patient's history of swelling. IMPRESSION: No acute cardiopulmonary process. Electronically Signed   By: Brett Fairy M.D.   On: 05/24/2022 23:38   CT Soft Tissue Neck W Contrast  Result Date: 05/24/2022 CLINICAL DATA:  Right-sided edema with tenderness to palpation EXAM: CT  NECK WITH CONTRAST TECHNIQUE: Multidetector CT imaging of the neck was performed using the standard protocol following the bolus administration of intravenous contrast. RADIATION DOSE REDUCTION: This exam was performed according to the departmental dose-optimization program which includes automated exposure control, adjustment of the mA and/or kV according to patient size and/or use of iterative reconstruction technique. CONTRAST:  51mL OMNIPAQUE IOHEXOL 300 MG/ML  SOLN COMPARISON:  None Available. FINDINGS: Pharynx and larynx: The tonsils are unremarkable. There is no retropharyngeal or peritonsillar fluid collection. Much of the oral cavity is obscured by streak artifact dental amalgam. The right half of the vallecula is effaced due to edema within right parapharyngeal soft tissues. Salivary glands: Right submandibular gland is enlarged and hyperenhancing. There is a 3 mm  calcification near the orifice of the right submandibular duct (series 3, image 49). There is marked edema surrounding the right submandibular gland with thickening of the right platysma. Thyroid: Normal Lymph nodes: There are bilateral reactive subcentimeter lymph nodes. Vascular: Negative. Limited intracranial: Negative. Visualized orbits: Negative. Mastoids and visualized paranasal sinuses: Clear. Skeleton: No acute or aggressive process. Upper chest: Negative. Other: None. IMPRESSION: 1. Right facial inflammation likely due to acute right submandibular sialadenitis with 3 mm calcification near the orifice of the right submandibular duct, likely an obstructing stone. 2. No abscess or drainable fluid collection. 3. Reactive lymphadenopathy. Electronically Signed   By: Ulyses Jarred M.D.   On: 05/24/2022 19:45        Scheduled Meds:  Chlorhexidine Gluconate Cloth  6 each Topical Daily   heparin  5,000 Units Subcutaneous Q8H   insulin aspart  0-9 Units Subcutaneous Q4H   mouth rinse  15 mL Mouth Rinse BID   [START ON 05/26/2022] methylPREDNISolone (SOLU-MEDROL) injection  40 mg Intravenous Daily   Continuous Infusions:  sodium chloride     ciprofloxacin     famotidine (PEPCID) IV Stopped (05/25/22 0156)   lactated ringers 75 mL/hr at 05/25/22 0652   metronidazole     vancomycin       LOS: 1 day   Time spent= 35 mins    Azula Zappia Arsenio Loader, MD [facility]  If 7PM-7AM, please contact night-coverage  05/25/2022, 7:21 AM

## 2022-05-25 NOTE — Assessment & Plan Note (Signed)
angioedema on deferential as well given recent use of Arb Will dc losartan for now Given pt unable to swallow secretions will keep npo  re consult ENT Monitor in step down Monitor airway

## 2022-05-26 LAB — CBC
HCT: 42.3 % (ref 39.0–52.0)
Hemoglobin: 13.9 g/dL (ref 13.0–17.0)
MCH: 28.7 pg (ref 26.0–34.0)
MCHC: 32.9 g/dL (ref 30.0–36.0)
MCV: 87.4 fL (ref 80.0–100.0)
Platelets: 395 10*3/uL (ref 150–400)
RBC: 4.84 MIL/uL (ref 4.22–5.81)
RDW: 16 % — ABNORMAL HIGH (ref 11.5–15.5)
WBC: 16.5 10*3/uL — ABNORMAL HIGH (ref 4.0–10.5)
nRBC: 0 % (ref 0.0–0.2)

## 2022-05-26 LAB — GLUCOSE, CAPILLARY
Glucose-Capillary: 103 mg/dL — ABNORMAL HIGH (ref 70–99)
Glucose-Capillary: 103 mg/dL — ABNORMAL HIGH (ref 70–99)
Glucose-Capillary: 118 mg/dL — ABNORMAL HIGH (ref 70–99)

## 2022-05-26 LAB — COMPREHENSIVE METABOLIC PANEL
ALT: 18 U/L (ref 0–44)
AST: 17 U/L (ref 15–41)
Albumin: 3.3 g/dL — ABNORMAL LOW (ref 3.5–5.0)
Alkaline Phosphatase: 49 U/L (ref 38–126)
Anion gap: 7 (ref 5–15)
BUN: 13 mg/dL (ref 8–23)
CO2: 25 mmol/L (ref 22–32)
Calcium: 8.7 mg/dL — ABNORMAL LOW (ref 8.9–10.3)
Chloride: 109 mmol/L (ref 98–111)
Creatinine, Ser: 0.86 mg/dL (ref 0.61–1.24)
GFR, Estimated: >60 mL/min (ref >60)
Glucose, Bld: 101 mg/dL — ABNORMAL HIGH (ref 70–99)
Potassium: 3 mmol/L — ABNORMAL LOW (ref 3.5–5.1)
Sodium: 141 mmol/L (ref 135–145)
Total Bilirubin: 1.2 mg/dL (ref 0.3–1.2)
Total Protein: 7.5 g/dL (ref 6.5–8.1)

## 2022-05-26 LAB — MAGNESIUM: Magnesium: 2.3 mg/dL (ref 1.7–2.4)

## 2022-05-26 MED ORDER — POTASSIUM CHLORIDE 20 MEQ PO PACK: 40.0000 meq | PACK | Freq: Once | ORAL | Status: DC

## 2022-05-26 MED ORDER — HYDRALAZINE HCL 50 MG PO TABS: 50.0000 mg | ORAL_TABLET | Freq: Three times a day (TID) | ORAL | 0 refills | Status: DC

## 2022-05-26 MED ORDER — PREDNISONE 20 MG PO TABS: 40.0000 mg | ORAL_TABLET | Freq: Every day | ORAL | 0 refills | Status: AC

## 2022-05-26 MED ORDER — AMOXICILLIN-POT CLAVULANATE 875-125 MG PO TABS: 1.0000 | ORAL_TABLET | Freq: Two times a day (BID) | ORAL | 0 refills | Status: AC

## 2022-05-26 MED ORDER — FAMOTIDINE 20 MG PO TABS
20.0000 mg | ORAL_TABLET | Freq: Two times a day (BID) | ORAL | 0 refills | Status: DC
Start: 2022-05-26 — End: 2023-11-21

## 2022-05-26 NOTE — Discharge Summary (Signed)
Physician Discharge Summary  Todd Rasmussen ZOX:[id number] DOB: 1951/08/31 DOA: 05/24/2022  PCP: Creola Cornusso, John, MD  Admit date: 05/24/2022 Discharge date: 05/26/2022  Admitted From:Home Disposition: Home  Recommendations for Outpatient Follow-up:  Follow up with PCP in 1-2 weeks Please obtain BMP/CBC in one week your next doctors visit.  Oral Augmentin twice daily for 9 more days Pepcid twice daily for 5 days Prednisone 40 mg daily for 7 more days Losartan discontinued.  He has been started on hydralazine 50 mg 3 times daily Advised low-salt diet Follow-up outpatient ENT, information given   Discharge Condition: Stable CODE STATUS: Full code Diet recommendation: Low-salt  Brief/Interim Summary: 71 year old with history of HTN, pericarditis in 2002, gout, fatty liver, alcohol and tobacco use, obesity came to the ED from PCPs office on 6/8 for fever and facial swelling for 2 days.  There was concerns of localized swelling on the right side of the face, with dyspnea and dysphagia.  He started taking losartan about a week ago, previously was on Norvasc.  CT showed acute right-sided submandibular sialadenitis with 3 mm obstructing stone.  Patient was started on antibiotics and steroids.  ENT was consulted.  Admitted to the ICU for closer airway monitoring.  Patient did well during the hospitalization and over couple of days his symptoms significantly improved therefore he was transitioned to oral antibiotics, steroids as mentioned above. Medically stable for discharge today with outpatient follow-up.     Assessment & Plan:  Principal Problem:   Neck abscess Active Problems:   Acute bacterial sialadenitis   Hyperkalemia   Acute respiratory failure with hypoxia (HCC)   Angioedema   Hyperglycemia   Abnormal ECG   Hypertension   Gout   Fatty liver   Right facial swelling   Sepsis secondary to acute right-sided submandibular sialadenitis  -Sepsis present on admission given fever,  leukocytosis.  Sepsis physiology is improving.  Admitted to the ICU for closer monitoring.  Patient has been seen by ENT who recommended steroids and antibiotics - Currently on broad-spectrum antibiotics-IV vancomycin, Cipro and Flagyl.  Transition to oral Augmentin for 9 more days to complete the course - Continue Solu-Medrol daily, Pepcid.  Will transition to p.o. - Diet as tolerated. - Chest x-ray-negative  Essential hypertension - Previously on losartan prior to admission and recently his Norvasc was discontinued.  Have started on hydralazine PO TID, this can be further adjusted outpatient as necessary    Hyponatremia/hypokalemia - replete prn   Transaminitis with elevated total bilirubin Fatty liver disease - Right upper quadrant ultrasound 2/23-fatty liver, renal cyst -Follow-up outpatient with PCP  Gout - Resume home meds  Hyperglycemia, likely from steroid use - A1c-5.3 Diet controlled.          Discharge Diagnoses:  Principal Problem:   Neck abscess Active Problems:   Acute bacterial sialadenitis   Hyperkalemia   Acute respiratory failure with hypoxia (HCC)   Angioedema   Hyperglycemia   Abnormal ECG   Hypertension   Gout   Fatty liver   Right facial swelling      Consultations: ENT Critical care  Subjective: Feels okay no complaints.  All questions answered.  Discharge Exam: Vitals:   05/25/22 2346 05/26/22 0413  BP: (!) 155/73 (!) 154/90  Pulse:  93  Resp:  18  Temp:  98.8 F (37.1 C)  SpO2:  98%   Vitals:   05/25/22 2300 05/25/22 2346 05/26/22 0413 05/26/22 0456  BP: (!) 159/83 (!) 155/73 (!) 154/90   Pulse:  93   Resp:   18   Temp:   98.8 F (37.1 C)   TempSrc:   Oral   SpO2:   98%   Weight:    122.8 kg  Height:        General: Pt is alert, awake, not in acute distress Cardiovascular: RRR, S1/S2 +, no rubs, no gallops Respiratory: CTA bilaterally, no wheezing, no rhonchi Abdominal: Soft, NT, ND, bowel sounds  + Extremities: no edema, no cyanosis  Discharge Instructions   Allergies as of 05/26/2022       Reactions   Losartan Swelling        Medication List     TAKE these medications    allopurinol 100 MG tablet Commonly known as: ZYLOPRIM Take 100 mg by mouth daily.   amoxicillin-clavulanate 875-125 MG tablet Commonly known as: AUGMENTIN Take 1 tablet by mouth 2 (two) times daily for 9 days.   famotidine 20 MG tablet Commonly known as: PEPCID Take 1 tablet (20 mg total) by mouth 2 (two) times daily for 5 days.   hydrALAZINE 50 MG tablet Commonly known as: APRESOLINE Take 1 tablet (50 mg total) by mouth 3 (three) times daily.   Multivitamin Men Tabs Take 1 tablet by mouth daily.   predniSONE 20 MG tablet Commonly known as: DELTASONE Take 2 tablets (40 mg total) by mouth daily with breakfast for 7 days.        Follow-up Information     Newman Pies, MD. Schedule an appointment as soon as possible for a visit in 1 week(s).   Specialty: Otolaryngology Contact information: [address] [place] [address] [phone]         Creola Corn, MD. Schedule an appointment as soon as possible for a visit in 1 week(s).   Specialty: Internal Medicine Contact information: [address] [place] [address] [phone]                Allergies  Allergen Reactions   Losartan Swelling    You were cared for by a hospitalist during your hospital stay. If you have any questions about your discharge medications or the care you received while you were in the hospital after you are discharged, you can call the unit and asked to speak with the hospitalist on call if the hospitalist that took care of you is not available. Once you are discharged, your primary care physician will handle any further medical issues. Please note that no refills for any discharge medications will be authorized once you are discharged, as it is imperative that you return to your  primary care physician (or establish a relationship with a primary care physician if you do not have one) for your aftercare needs so that they can reassess your need for medications and monitor your lab values.   Procedures/Studies: ECHOCARDIOGRAM COMPLETE  Result Date: 05/25/2022    ECHOCARDIOGRAM REPORT   Patient Name:   Todd Rasmussen Date of Exam: 05/25/2022 Medical Rec #:  [id number]      Height:       70.0 in Accession #:    [id number]     Weight:       259.0 lb Date of Birth:  12-14-1951      BSA:          2.330 m Patient Age:    70 years       BP:           150/94 mmHg Patient Gender: M  HR:           99 bpm. Exam Location:  Inpatient Procedure: 2D Echo, Color Doppler and Cardiac Doppler Indications:    Abnormal ekg  History:        Patient has no prior history of Echocardiogram examinations.                 Risk Factors:Hypertension and Current Smoker.  Sonographer:    Rodrigo Ran RCS Referring Phys: [phone] ANASTASSIA DOUTOVA  Sonographer Comments: Patient is morbidly obese. IMPRESSIONS  1. Left ventricular ejection fraction, by estimation, is 60 to 65%. The left ventricle has normal function. The left ventricle has no regional wall motion abnormalities. Left ventricular diastolic parameters are consistent with Grade I diastolic dysfunction (impaired relaxation).  2. Right ventricular systolic function is normal. The right ventricular size is normal. Tricuspid regurgitation signal is inadequate for assessing PA pressure.  3. The mitral valve is normal in structure. Trivial mitral valve regurgitation. No evidence of mitral stenosis.  4. The aortic valve is tricuspid. Aortic valve regurgitation is not visualized. No aortic stenosis is present.  5. The inferior vena cava is normal in size with greater than 50% respiratory variability, suggesting right atrial pressure of 3 mmHg. FINDINGS  Left Ventricle: Left ventricular ejection fraction, by estimation, is 60 to 65%. The left ventricle has  normal function. The left ventricle has no regional wall motion abnormalities. The left ventricular internal cavity size was normal in size. There is  no left ventricular hypertrophy. Left ventricular diastolic parameters are consistent with Grade I diastolic dysfunction (impaired relaxation). Right Ventricle: The right ventricular size is normal. No increase in right ventricular wall thickness. Right ventricular systolic function is normal. Tricuspid regurgitation signal is inadequate for assessing PA pressure. Left Atrium: Left atrial size was normal in size. Right Atrium: Right atrial size was normal in size. Pericardium: There is no evidence of pericardial effusion. Mitral Valve: The mitral valve is normal in structure. Trivial mitral valve regurgitation. No evidence of mitral valve stenosis. Tricuspid Valve: The tricuspid valve is normal in structure. Tricuspid valve regurgitation is not demonstrated. Aortic Valve: The aortic valve is tricuspid. Aortic valve regurgitation is not visualized. No aortic stenosis is present. Aortic valve mean gradient measures 8.0 mmHg. Aortic valve peak gradient measures 13.2 mmHg. Aortic valve area, by VTI measures 2.68  cm. Pulmonic Valve: The pulmonic valve was normal in structure. Pulmonic valve regurgitation is not visualized. Aorta: The aortic root is normal in size and structure. Venous: The inferior vena cava is normal in size with greater than 50% respiratory variability, suggesting right atrial pressure of 3 mmHg. IAS/Shunts: No atrial level shunt detected by color flow Doppler.  LEFT VENTRICLE PLAX 2D LVIDd:         4.40 cm   Diastology LVIDs:         2.90 cm   LV e' medial:    11.20 cm/s LV PW:         1.10 cm   LV E/e' medial:  8.2 LV IVS:        0.90 cm   LV e' lateral:   11.90 cm/s LVOT diam:     2.00 cm   LV E/e' lateral: 7.7 LV SV:         89 LV SV Index:   38 LVOT Area:     3.14 cm  RIGHT VENTRICLE             IVC RV Basal diam:  4.10 cm  IVC diam: 1.40 cm  RV S prime:     18.20 cm/s TAPSE (M-mode): 3.4 cm LEFT ATRIUM             Index        RIGHT ATRIUM           Index LA diam:        2.70 cm 1.16 cm/m   RA Area:     13.20 cm LA Vol (A2C):   42.7 ml 18.33 ml/m  RA Volume:   31.10 ml  13.35 ml/m LA Vol (A4C):   46.5 ml 19.96 ml/m LA Biplane Vol: 45.9 ml 19.70 ml/m  AORTIC VALVE AV Area (Vmax):    2.74 cm AV Area (Vmean):   2.73 cm AV Area (VTI):     2.68 cm AV Vmax:           182.00 cm/s AV Vmean:          131.000 cm/s AV VTI:            0.331 m AV Peak Grad:      13.2 mmHg AV Mean Grad:      8.0 mmHg LVOT Vmax:         159.00 cm/s LVOT Vmean:        114.000 cm/s LVOT VTI:          0.282 m LVOT/AV VTI ratio: 0.85  AORTA Ao Root diam: 3.40 cm Ao Asc diam:  3.40 cm MITRAL VALVE MV Area (PHT): 14.31 cm    SHUNTS MV Decel Time: 53 msec      Systemic VTI:  0.28 m MV E velocity: 91.30 cm/s   Systemic Diam: 2.00 cm MV A velocity: 114.00 cm/s MV E/A ratio:  0.80 Dalton McleanMD Electronically signed by Wilfred Lacy Signature Date/Time: 05/25/2022/5:08:01 PM    Final    DG CHEST PORT 1 VIEW  Result Date: 05/24/2022 CLINICAL DATA:  Angioedema and fever.  Swelling under neck. EXAM: PORTABLE CHEST 1 VIEW COMPARISON:  None Available. FINDINGS: The heart size and mediastinal contours are within normal limits. Lung volumes are low. No consolidation, effusion, or pneumothorax. No acute osseous abnormality. Prominent soft tissue is noted in the cervical region which may be associated with patient's history of swelling. IMPRESSION: No acute cardiopulmonary process. Electronically Signed   By: Thornell Sartorius M.D.   On: 05/24/2022 23:38   CT Soft Tissue Neck W Contrast  Result Date: 05/24/2022 CLINICAL DATA:  Right-sided edema with tenderness to palpation EXAM: CT NECK WITH CONTRAST TECHNIQUE: Multidetector CT imaging of the neck was performed using the standard protocol following the bolus administration of intravenous contrast. RADIATION DOSE REDUCTION: This exam was  performed according to the departmental dose-optimization program which includes automated exposure control, adjustment of the mA and/or kV according to patient size and/or use of iterative reconstruction technique. CONTRAST:  92mL OMNIPAQUE IOHEXOL 300 MG/ML  SOLN COMPARISON:  None Available. FINDINGS: Pharynx and larynx: The tonsils are unremarkable. There is no retropharyngeal or peritonsillar fluid collection. Much of the oral cavity is obscured by streak artifact dental amalgam. The right half of the vallecula is effaced due to edema within right parapharyngeal soft tissues. Salivary glands: Right submandibular gland is enlarged and hyperenhancing. There is a 3 mm calcification near the orifice of the right submandibular duct (series 3, image 49). There is marked edema surrounding the right submandibular gland with thickening of the right platysma. Thyroid: Normal Lymph nodes: There are bilateral reactive subcentimeter lymph nodes. Vascular: Negative.  Limited intracranial: Negative. Visualized orbits: Negative. Mastoids and visualized paranasal sinuses: Clear. Skeleton: No acute or aggressive process. Upper chest: Negative. Other: None. IMPRESSION: 1. Right facial inflammation likely due to acute right submandibular sialadenitis with 3 mm calcification near the orifice of the right submandibular duct, likely an obstructing stone. 2. No abscess or drainable fluid collection. 3. Reactive lymphadenopathy. Electronically Signed   By: Deatra Robinson M.D.   On: 05/24/2022 19:45     The results of significant diagnostics from this hospitalization (including imaging, microbiology, ancillary and laboratory) are listed below for reference.     Microbiology: Recent Results (from the past 240 hour(s))  MRSA Next Gen by PCR, Nasal     Status: None   Collection Time: 05/25/22  3:58 AM   Specimen: Nasal Mucosa; Nasal Swab  Result Value Ref Range Status   MRSA by PCR Next Gen NOT DETECTED NOT DETECTED Final     Comment: (NOTE) The GeneXpert MRSA Assay (FDA approved for NASAL specimens only), is one component of a comprehensive MRSA colonization surveillance program. It is not intended to diagnose MRSA infection nor to guide or monitor treatment for MRSA infections. Test performance is not FDA approved in patients less than 52 years old. Performed at [facility], [address] [place], [place] [address]      Labs: BNP (last 3 results) No results for input(s): "BNP" in the last 8760 hours. Basic Metabolic Panel: Recent Labs  Lab 05/24/22 1754 05/24/22 2233 05/25/22 0622 05/26/22 0633  NA 139 131* 138 141  K 5.9* 3.0* 3.0* 3.0*  CL 102 96* 102 109  CO2 GLUCOSE 88 399* 133* 101*  BUN CREATININE 0.73 0.86 0.75 0.86  CALCIUM 8.2* 7.9* 8.2* 8.7*  MG  --  1.9 2.1 2.3  PHOS  --  2.5 3.2  --    Liver Function Tests: Recent Labs  Lab 05/24/22 1754 05/24/22 2233 05/25/22 0622 05/26/22 0633  AST 44* ALT ALKPHOS 56 48 53 49  BILITOT 2.8* 1.4* 1.5* 1.2  PROT 8.5* 7.4 7.9 7.5  ALBUMIN 3.6 3.1* 3.3* 3.3*   No results for input(s): "LIPASE", "AMYLASE" in the last 168 hours. No results for input(s): "AMMONIA" in the last 168 hours. CBC: Recent Labs  Lab 05/24/22 1754 05/25/22 0622 05/26/22 0633  WBC 14.1* 15.6* 16.5*  NEUTROABS 10.7*  --   --   HGB 14.9 13.9 13.9  HCT 45.0 41.7 42.3  MCV 88.2 88.2 87.4  PLT 369 358 395   Cardiac Enzymes: Recent Labs  Lab 05/24/22 2233  CKTOTAL 77   BNP: Invalid input(s): "POCBNP" CBG: Recent Labs  Lab 05/25/22 1845 05/25/22 2101 05/26/22 0023 05/26/22 0414 05/26/22 0746  GLUCAP 108* 100* 118* 103* 103*   D-Dimer No results for input(s): "DDIMER" in the last 72 hours. Hgb A1c Recent Labs    05/24/22 1754  HGBA1C 5.3   Lipid Profile No results for input(s): "CHOL", "HDL", "LDLCALC", "TRIG", "CHOLHDL", "LDLDIRECT" in the last 72 hours. Thyroid function  studies No results for input(s): "TSH", "T4TOTAL", "T3FREE", "THYROIDAB" in the last 72 hours.  Invalid input(s): "FREET3" Anemia work up No results for input(s): "VITAMINB12", "FOLATE", "FERRITIN", "TIBC", "IRON", "RETICCTPCT" in the last 72 hours. Urinalysis No results found for: "COLORURINE", "APPEARANCEUR", "LABSPEC", "PHURINE", "GLUCOSEU", "HGBUR", "BILIRUBINUR", "KETONESUR", "PROTEINUR", "UROBILINOGEN", "NITRITE", "LEUKOCYTESUR" Sepsis Labs Recent Labs  Lab 05/24/22 1754 05/25/22 0622 05/26/22 9147  WBC 14.1* 15.6* 16.5*   Microbiology Recent Results (from the past 240 hour(s))  MRSA Next Gen by PCR, Nasal     Status: None   Collection Time: 05/25/22  3:58 AM   Specimen: Nasal Mucosa; Nasal Swab  Result Value Ref Range Status   MRSA by PCR Next Gen NOT DETECTED NOT DETECTED Final    Comment: (NOTE) The GeneXpert MRSA Assay (FDA approved for NASAL specimens only), is one component of a comprehensive MRSA colonization surveillance program. It is not intended to diagnose MRSA infection nor to guide or monitor treatment for MRSA infections. Test performance is not FDA approved in patients less than 49 years old. Performed at [facility], [address], [place], [place] [address]      Time coordinating discharge:  I have spent 35 minutes face to face with the patient and on the ward discussing the patients care, assessment, plan and disposition with other care givers. >50% of the time was devoted counseling the patient about the risks and benefits of treatment/Discharge disposition and coordinating care.   SIGNED:   Dimple Nanas, MD  [facility] 05/26/2022, 9:31 AM   If 7PM-7AM, please contact night-coverage

## 2022-05-26 NOTE — Progress Notes (Signed)
Pt discharged to home with daughter. Discharge instructions and medication education provided to pt.  

## 2023-07-10 IMAGING — CT CT NECK W/ CM
3 of 4 series · 12 of 33 positions shown, 14 images · IV contrast (agent unspecified)
Comparison: None Available.

CLINICAL DATA: Right-sided edema with tenderness to palpation

EXAM:
CT NECK WITH CONTRAST
TECHNIQUE: Multidetector CT imaging of the neck was performed using the
standard protocol following the bolus administration of intravenous
contrast.

[Series 6: axial · axial · 0.39mm/px · z∈[+1279,+1451]mm · 4 of 133 slices shown, 5 images]
[im 23/133  soft-tissue]
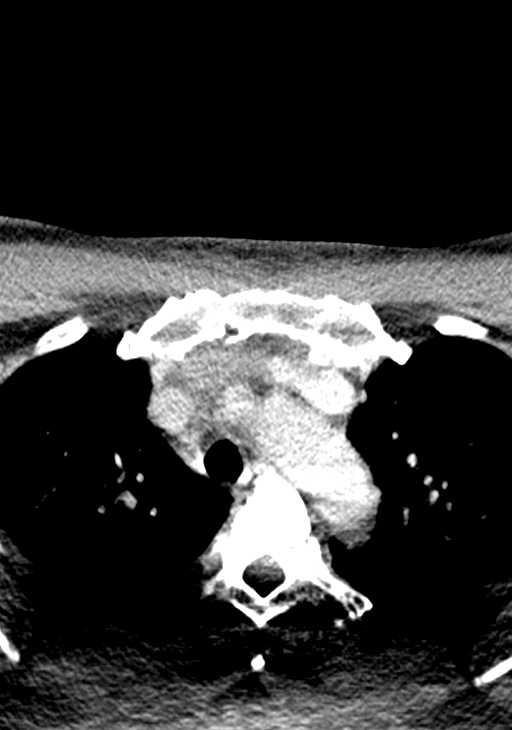
[im 23/133  bone]
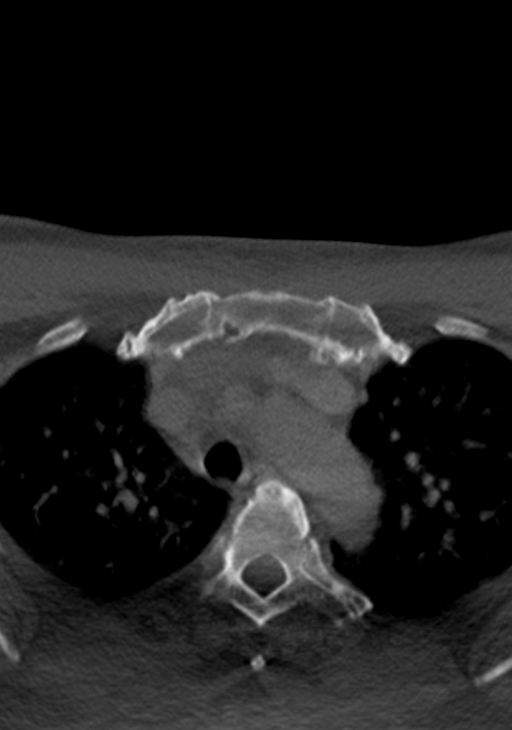
[im 45/133  bone]
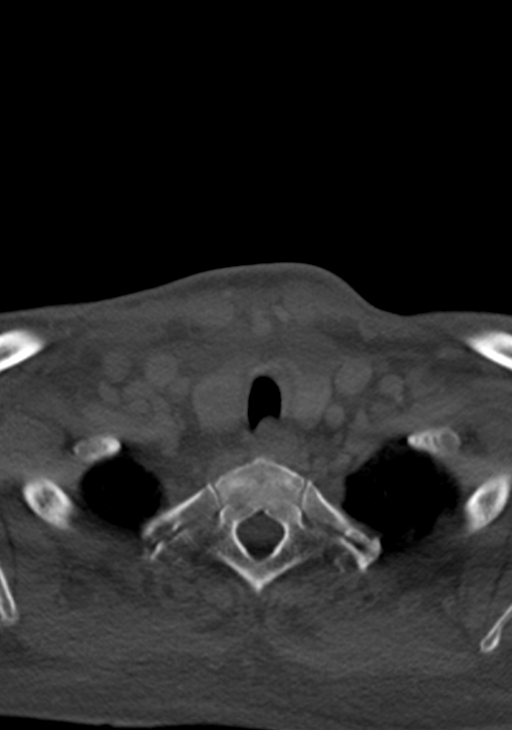
[im 89/133  bone]
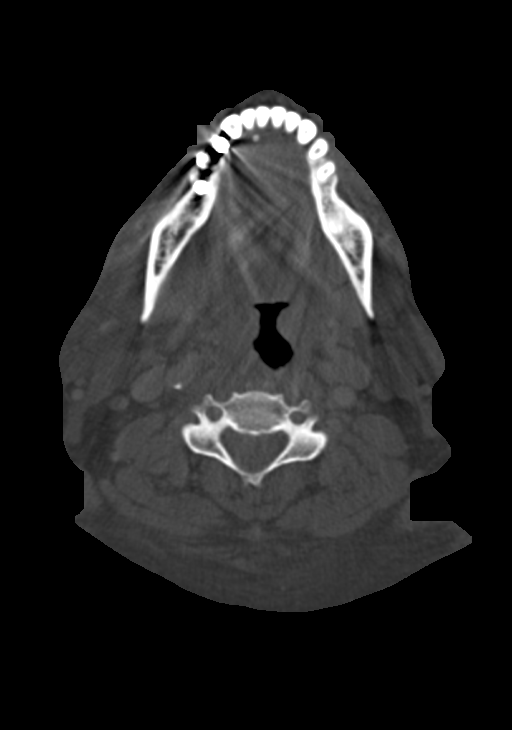
[im 111/133  bone]
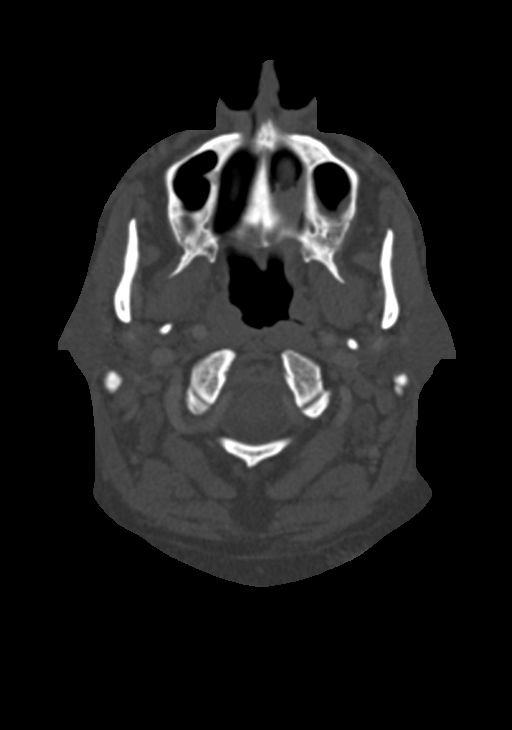

[Series 7: coronal · coronal · 0.39mm/px · 3 of 143 slices shown]
[im 32/143  bone]
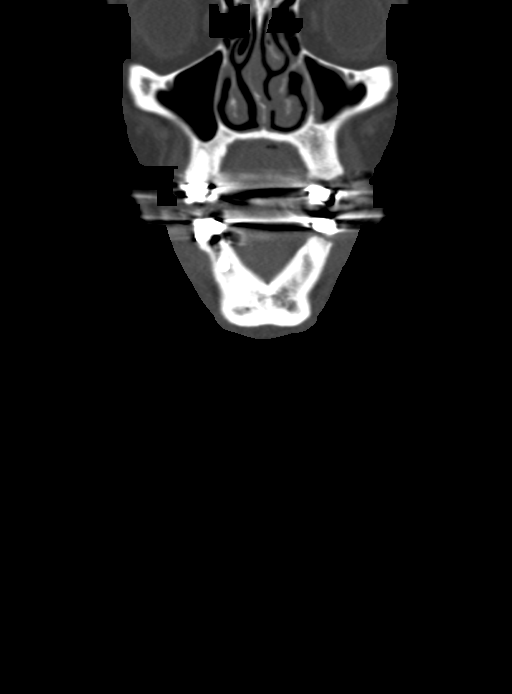
[im 58/143  bone]
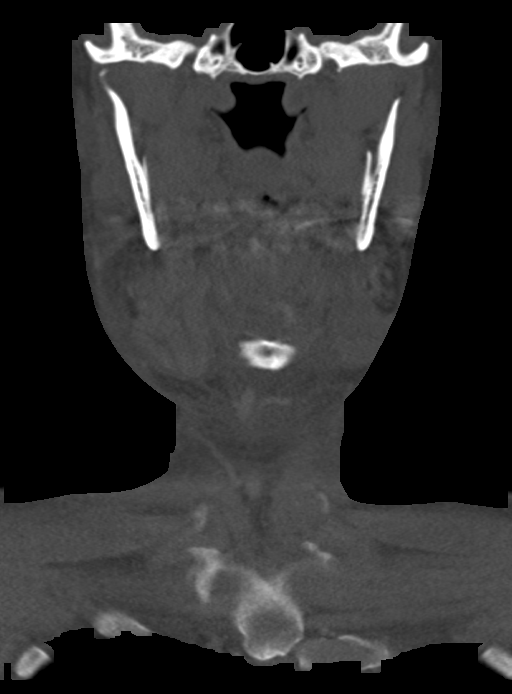
[im 85/143  bone]
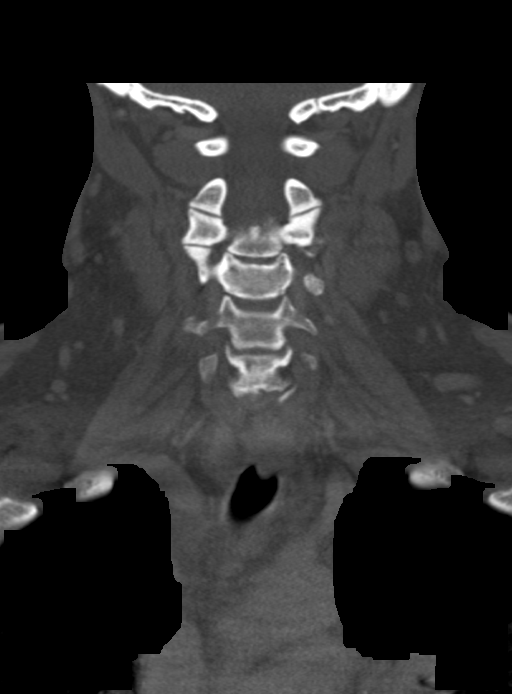

[Series 8: sagittal · sagittal · 0.53mm/px · 5 of 101 slices shown, 6 images]
[im 34/101  bone]
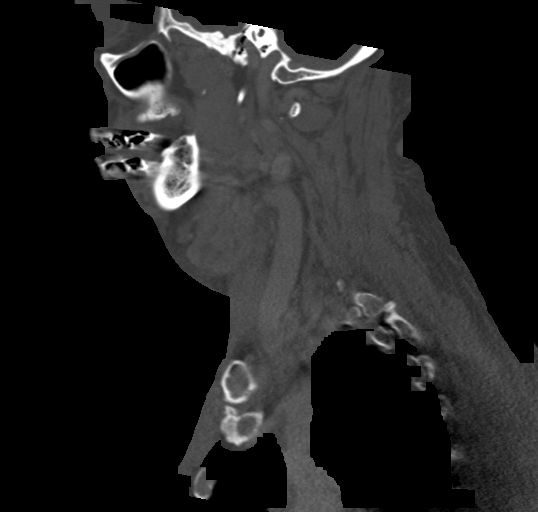
[im 42/101  bone]
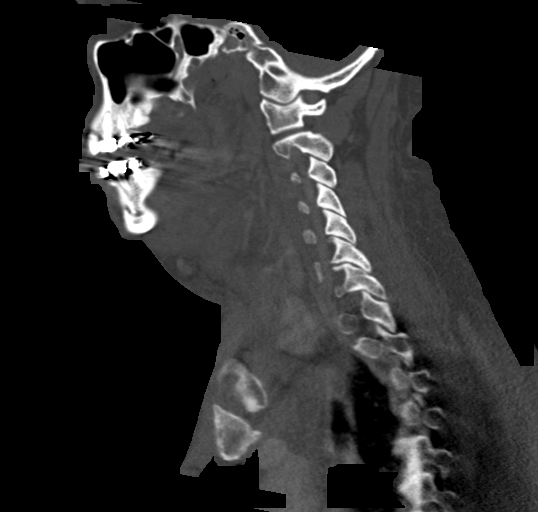
[im 51/101  soft-tissue]
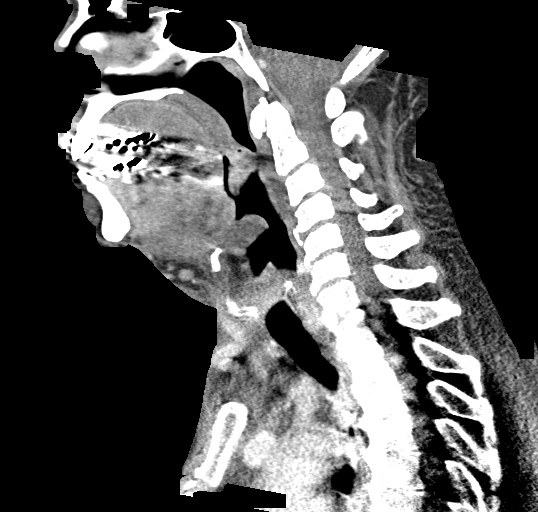
[im 51/101  bone]
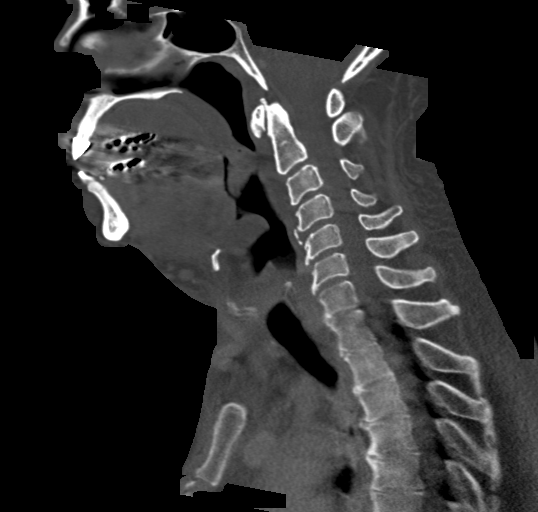
[im 59/101  bone]
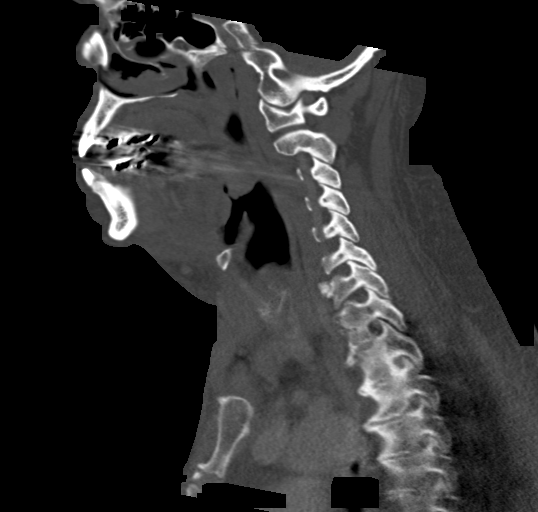
[im 67/101  bone]
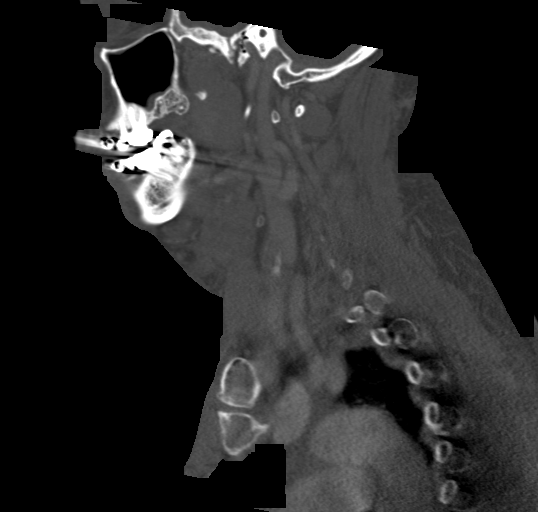

[12 of 33 positions shown; findings below may reference images not displayed]

RADIATION DOSE REDUCTION: This exam was performed according to the
departmental dose-optimization program which includes automated
exposure control, adjustment of the mA and/or kV according to
patient size and/or use of iterative reconstruction technique.

CONTRAST:  75mL OMNIPAQUE IOHEXOL 300 MG/ML  SOLN
FINDINGS: Pharynx and larynx: The tonsils are unremarkable. There is no
retropharyngeal or peritonsillar fluid collection. Much of the oral
cavity is obscured by streak artifact dental amalgam. The right half
of the vallecula is effaced due to edema within right parapharyngeal
soft tissues.

Salivary glands: Right submandibular gland is enlarged and
hyperenhancing. There is a 3 mm calcification near the orifice of
the right submandibular duct (series 3, image 49). There is marked
edema surrounding the right submandibular gland with thickening of
the right platysma.

Thyroid: Normal

Lymph nodes: There are bilateral reactive subcentimeter lymph nodes.

Vascular: Negative.

Limited intracranial: Negative.

Visualized orbits: Negative.

Mastoids and visualized paranasal sinuses: Clear.

Skeleton: No acute or aggressive process.

Upper chest: Negative.

Other: None.
IMPRESSION: 1. Right facial inflammation likely due to acute right submandibular
sialadenitis with 3 mm calcification near the orifice of the right
submandibular duct, likely an obstructing stone.
2. No abscess or drainable fluid collection.
3. Reactive lymphadenopathy.

## 2023-08-09 ENCOUNTER — Other Ambulatory Visit (HOSPITAL_COMMUNITY): Payer: Self-pay | Admitting: Internal Medicine

## 2023-08-09 DIAGNOSIS — R609 Edema, unspecified: Secondary | ICD-10-CM

## 2023-08-12 ENCOUNTER — Ambulatory Visit (HOSPITAL_COMMUNITY)
Admission: RE | Admit: 2023-08-12 | Discharge: 2023-08-12 | Disposition: A | Discharge status: Home / Self Care | Payer: Medicare Other | Source: Ambulatory Visit | Attending: Internal Medicine | Admitting: Internal Medicine

## 2023-08-12 DIAGNOSIS — R609 Edema, unspecified: Secondary | ICD-10-CM | POA: Diagnosis present

## 2023-08-23 ENCOUNTER — Other Ambulatory Visit: Payer: Self-pay | Admitting: Oncology

## 2023-08-23 DIAGNOSIS — Z006 Encounter for examination for normal comparison and control in clinical research program: Secondary | ICD-10-CM

## 2023-08-26 ENCOUNTER — Other Ambulatory Visit (HOSPITAL_COMMUNITY)
Admission: RE | Admit: 2023-08-26 | Discharge: 2023-08-26 | Disposition: A | Discharge status: Home / Self Care | Payer: Medicare Other | Source: Ambulatory Visit | Attending: Oncology | Admitting: Oncology

## 2023-08-26 DIAGNOSIS — Z006 Encounter for examination for normal comparison and control in clinical research program: Secondary | ICD-10-CM | POA: Insufficient documentation

## 2023-09-03 LAB — GENECONNECT MOLECULAR SCREEN: Genetic Analysis Overall Interpretation: NEGATIVE

## 2023-11-20 ENCOUNTER — Observation Stay (HOSPITAL_COMMUNITY)
Admission: EM | Admit: 2023-11-20 | Discharge: 2023-11-21 | Disposition: A | Discharge status: Home / Self Care | Payer: Medicare Other | Attending: Internal Medicine | Admitting: Internal Medicine

## 2023-11-20 ENCOUNTER — Encounter (HOSPITAL_COMMUNITY): Payer: Self-pay | Admitting: Emergency Medicine

## 2023-11-20 ENCOUNTER — Emergency Department (HOSPITAL_COMMUNITY): Payer: Medicare Other

## 2023-11-20 DIAGNOSIS — K1121 Acute sialoadenitis: Secondary | ICD-10-CM | POA: Diagnosis not present

## 2023-11-20 DIAGNOSIS — I10 Essential (primary) hypertension: Secondary | ICD-10-CM | POA: Diagnosis not present

## 2023-11-20 DIAGNOSIS — K1120 Sialoadenitis, unspecified: Secondary | ICD-10-CM

## 2023-11-20 DIAGNOSIS — R221 Localized swelling, mass and lump, neck: Principal | ICD-10-CM

## 2023-11-20 DIAGNOSIS — M109 Gout, unspecified: Secondary | ICD-10-CM | POA: Diagnosis present

## 2023-11-20 DIAGNOSIS — E876 Hypokalemia: Secondary | ICD-10-CM | POA: Diagnosis not present

## 2023-11-20 DIAGNOSIS — F17210 Nicotine dependence, cigarettes, uncomplicated: Secondary | ICD-10-CM | POA: Insufficient documentation

## 2023-11-20 DIAGNOSIS — Z79899 Other long term (current) drug therapy: Secondary | ICD-10-CM | POA: Diagnosis not present

## 2023-11-20 LAB — CBC WITH DIFFERENTIAL/PLATELET
Abs Immature Granulocytes: 0.01 10*3/uL (ref 0.00–0.07)
Basophils Absolute: 0.1 10*3/uL (ref 0.0–0.1)
Basophils Relative: 1 %
Eosinophils Absolute: 0.4 10*3/uL (ref 0.0–0.5)
Eosinophils Relative: 4 %
HCT: 44.5 % (ref 39.0–52.0)
Hemoglobin: 14.8 g/dL (ref 13.0–17.0)
Immature Granulocytes: 0 %
Lymphocytes Relative: 32 %
Lymphs Abs: 3.1 10*3/uL (ref 0.7–4.0)
MCH: 30 pg (ref 26.0–34.0)
MCHC: 33.3 g/dL (ref 30.0–36.0)
MCV: 90.1 fL (ref 80.0–100.0)
Monocytes Absolute: 0.5 10*3/uL (ref 0.1–1.0)
Monocytes Relative: 5 %
Neutro Abs: 5.6 10*3/uL (ref 1.7–7.7)
Neutrophils Relative %: 58 %
Platelets: 328 10*3/uL (ref 150–400)
RBC: 4.94 MIL/uL (ref 4.22–5.81)
RDW: 15.2 % (ref 11.5–15.5)
WBC: 9.7 10*3/uL (ref 4.0–10.5)
nRBC: 0 % (ref 0.0–0.2)

## 2023-11-20 LAB — COMPREHENSIVE METABOLIC PANEL
ALT: 22 U/L (ref 0–44)
AST: 22 U/L (ref 15–41)
Albumin: 3.9 g/dL (ref 3.5–5.0)
Alkaline Phosphatase: 57 U/L (ref 38–126)
Anion gap: 10 (ref 5–15)
BUN: 13 mg/dL (ref 8–23)
CO2: 27 mmol/L (ref 22–32)
Calcium: 9 mg/dL (ref 8.9–10.3)
Chloride: 103 mmol/L (ref 98–111)
Creatinine, Ser: 1.07 mg/dL (ref 0.61–1.24)
GFR, Estimated: >60 mL/min (ref >60)
Glucose, Bld: 142 mg/dL — ABNORMAL HIGH (ref 70–99)
Potassium: 3 mmol/L — ABNORMAL LOW (ref 3.5–5.1)
Sodium: 140 mmol/L (ref 135–145)
Total Bilirubin: 0.9 mg/dL (ref <1.2)
Total Protein: 7.7 g/dL (ref 6.5–8.1)

## 2023-11-20 MED ORDER — FAMOTIDINE IN NACL 20-0.9 MG/50ML-% IV SOLN
20.0000 mg | Freq: Once | INTRAVENOUS | Status: AC
Start: 2023-11-20 — End: 2023-11-20
  Administered 2023-11-20: 20 mg via INTRAVENOUS
  Filled 2023-11-20: qty 50

## 2023-11-20 MED ORDER — SODIUM CHLORIDE 0.9 % IV SOLN
2.0000 g | Freq: Once | INTRAVENOUS | Status: AC
  Administered 2023-11-20: 2 g via INTRAVENOUS
  Filled 2023-11-20: qty 20

## 2023-11-20 MED ORDER — DIPHENHYDRAMINE HCL 50 MG/ML IJ SOLN
25.0000 mg | Freq: Once | INTRAMUSCULAR | Status: AC
  Administered 2023-11-20: 25 mg via INTRAVENOUS
  Filled 2023-11-20: qty 1

## 2023-11-20 MED ORDER — IOHEXOL 300 MG/ML  SOLN
75.0000 mL | Freq: Once | INTRAMUSCULAR | Status: AC | PRN
  Administered 2023-11-20: 75 mL via INTRAVENOUS

## 2023-11-20 MED ORDER — METHYLPREDNISOLONE SODIUM SUCC 125 MG IJ SOLR
125.0000 mg | Freq: Once | INTRAMUSCULAR | Status: AC
  Administered 2023-11-20: 125 mg via INTRAVENOUS
  Filled 2023-11-20: qty 2

## 2023-11-20 NOTE — H&P (Signed)
History and Physical    Todd Rasmussen FAO:[id number] DOB: 1951/10/24 DOA: 11/20/2023  PCP: Creola Corn, MD  Patient coming from: Home  I have personally briefly reviewed patient's old medical records in [facility]  Chief Complaint: Left-sided neck swelling  HPI: Todd Rasmussen is a 72 y.o. male with medical history significant for HTN, gout who presented to the ED for evaluation of left-sided neck swelling.  Patient states he noticed swelling to the left side of his neck this morning.  While at work swelling progressively worsened.  The area was tender when he would touch the left side of his lower jaw/upper neck area.  He felt like he was beginning to have some difficulty swallowing therefore he came to the ED for further evaluation.  He feels like he has phlegm in his throat and having to frequently clear his throat.  Patient does report recent URI symptoms with rhinorrhea, cough.  Patient was previously admitted in June 2023 with right-sided mandibular sialoadenitis.  At that time there was a question of allergic reaction to losartan.  ED Course  Labs/Imaging on admission: I have personally reviewed following labs and imaging studies.  Initial vitals showed BP 171/96, pulse 75, RR 18, temp 98.0 F, SpO2 100% on room air.  Labs showed WBC 9.7, hemoglobin 14.8, platelets 328,000, sodium 140, potassium 3.0, bicarb 27, BUN 13, creatinine 1.07, serum glucose 142, LFTs within normal limits.  CT soft tissue neck with contrast showed markedly enlarged left submandibular gland with surrounding Flamm Tory stranding and thickening of the overlying platysma.  No sialolithiasis.  Findings consistent with acute sialoadenitis.  Patient was given IV ceftriaxone, IV Solu-Medrol 125 mg, IV Pepcid 20 mg, IV Benadryl 25 mg.  The hospitalist service was consulted to admit for further evaluation and management.  Review of Systems: All systems reviewed and are negative except as documented in  history of present illness above.   Past Medical History:  Diagnosis Date   Fatty liver 05/25/2022   Noted on [place] 01/2022   Gout 05/25/2022   Hypertension 05/25/2022    History reviewed. No pertinent surgical history.  Social History:  reports that he has been smoking cigarettes. He has never used smokeless tobacco. He reports current alcohol use. No history on file for drug use.  Allergies  Allergen Reactions   Losartan Swelling    Family History  Problem Relation Age of Onset   Diabetes Mother      Prior to Admission medications   Medication Sig Start Date End Date Taking? Authorizing Provider  allopurinol (ZYLOPRIM) 100 MG tablet Take 100 mg by mouth daily. 05/19/22   [provider]  famotidine (PEPCID) 20 MG tablet Take 1 tablet (20 mg total) by mouth 2 (two) times daily for 5 days. 05/26/22 05/31/22  Amin, Ankit C, MD  hydrALAZINE (APRESOLINE) 50 MG tablet Take 1 tablet (50 mg total) by mouth 3 (three) times daily. 05/26/22 06/25/22  Miguel Rota, MD  Multiple Vitamins-Minerals (MULTIVITAMIN MEN) TABS Take 1 tablet by mouth daily.    [provider]    Physical Exam: Vitals:   11/20/23 2046 11/20/23 2102 11/20/23 2215 11/20/23 2329  BP: (!) 171/96 (!) 183/105 (!) 176/104 (!) 155/97  Pulse: 75 72 66 73  Resp: 18 18 18 18   Temp: 98 F (36.7 C) 98.2 F (36.8 C)  98.5 F (36.9 C)  TempSrc: Oral Oral  Oral  SpO2: 100% 98% 96% 95%   Constitutional: Resting in bed, NAD, calm, comfortable Eyes:  EOMI, lids and conjunctivae normal ENMT: Mucous membranes are moist. Posterior pharynx clear of any exudate or lesions. Neck: Swelling to left lower mandibular and submandibular area.  Tender to palpation, most notably to the left submandibular gland.  No overlying erythema, open wound or discharge. Respiratory: clear to auscultation bilaterally, no wheezing, no crackles. Normal respiratory effort. No accessory muscle use.  Cardiovascular: Regular rate and rhythm, no  murmurs / rubs / gallops. No extremity edema. 2+ pedal pulses. Abdomen: no tenderness, no masses palpated. No hepatosplenomegaly. Bowel sounds positive.  Musculoskeletal: no clubbing / cyanosis. No joint deformity upper and lower extremities. Good ROM, no contractures. Normal muscle tone.  Skin: no rashes, lesions, ulcers. No induration Neurologic: Sensation intact. Strength 5/5 in all 4.  Psychiatric: Normal judgment and insight. Alert and oriented x 3. Normal mood.   EKG: Not performed.  Assessment/Plan Principal Problem:   Acute left submandibular sialoadenitis Active Problems:   Hypokalemia   Hypertension   Gout   Jantzen Rasmussen is a 72 y.o. male with medical history significant for HTN, gout who is admitted with acute left submandibular sialoadenitis.  Assessment and Plan: Acute left submandibular sialoadenitis: Noted on CT imaging, no report of abscess.  Potentially viral etiology given recent URI symptoms but will be covered with antibiotics.  Currently protecting airway, speaking in clear full sentences, controlling secretions. -Continue IV Unasyn -Monitor airway  Hypokalemia: Supplementing.  Hypertension: Continue felodipine.  Gout: Continue allopurinol.   DVT prophylaxis: enoxaparin (LOVENOX) injection 40 mg Start: 11/21/23 2200 Code Status: Full code, confirmed with patient on admission Family Communication: Spouse and daughter at bedside Disposition Plan: From home and likely discharge to home pending clinical progress Consults called: None Severity of Illness: The appropriate patient status for this patient is OBSERVATION. Observation status is judged to be reasonable and necessary in order to provide the required intensity of service to ensure the patient's safety. The patient's presenting symptoms, physical exam findings, and initial radiographic and laboratory data in the context of their medical condition is felt to place them at decreased risk for further  clinical deterioration. Furthermore, it is anticipated that the patient will be medically stable for discharge from the hospital within 2 midnights of admission.   Darreld Mclean MD [facility]  If 7PM-7AM, please contact night-coverage [email or web address]  11/21/2023, 1:11 AM

## 2023-11-20 NOTE — ED Triage Notes (Addendum)
PT woke up this morning with swelling to chin/neck. He went to work and it became worse. No obvious injury or wound. Tender to palpation. Feels hard. No oral swelling or issues with breathing. Denies SOB. States it has been worsening rapidly over course of the day. PT reports this happened a year ago and it was due to reaction/ allergy to a medication.

## 2023-11-20 NOTE — ED Provider Notes (Signed)
[place] EMERGENCY DEPARTMENT AT [facility] Provider Note   CSN: [id number] Arrival date & time: 11/20/23  2001     History  Chief Complaint  Patient presents with   Neck Injury   Oral Swelling    Todd Rasmussen is a 72 y.o. male.  Patient complains of swelling in the left side of his neck.  Patient reports that he noticed this a.m.  Patient reports he has had increased swelling and pain.  Patient came to the emergency department because he felt like he was having some difficulty swallowing.  Patient reports that he feels like he has phlegm in his throat.  Patient is frequently clearing his throat.  Patient reports he had a similar episode a year and a half ago.  Patient was admitted.  Patient reports he was told that it could have been due to a reaction to a medication he was taking.  Patient also reports that he was told that he could have a salivary infection.  Patient denies any fever or chills he has not had any cough or congestion.  Denies any shortness of breath.  The history is provided by the patient. No language interpreter was used.  Neck Injury       Home Medications Prior to Admission medications   Medication Sig Start Date End Date Taking? Authorizing Provider  allopurinol (ZYLOPRIM) 100 MG tablet Take 100 mg by mouth daily. 05/19/22   [provider]  famotidine (PEPCID) 20 MG tablet Take 1 tablet (20 mg total) by mouth 2 (two) times daily for 5 days. 05/26/22 05/31/22  Amin, Ankit C, MD  hydrALAZINE (APRESOLINE) 50 MG tablet Take 1 tablet (50 mg total) by mouth 3 (three) times daily. 05/26/22 06/25/22  Miguel Rota, MD  Multiple Vitamins-Minerals (MULTIVITAMIN MEN) TABS Take 1 tablet by mouth daily.    [provider]      Allergies    Losartan    Review of Systems   Review of Systems  All other systems reviewed and are negative.   Physical Exam Updated Vital Signs BP (!) 176/104   Pulse 66   Temp 98.2 F (36.8 C) (Oral)    Resp 18   SpO2 96%  Physical Exam Vitals and nursing note reviewed.  Constitutional:      Appearance: He is well-developed.  HENT:     Head: Normocephalic.     Nose: Nose normal.     Mouth/Throat:     Mouth: Mucous membranes are moist.     Comments: Throat is clear no swelling palate no edema Neck:     Comments: Swollen tender left neck, Cardiovascular:     Rate and Rhythm: Normal rate.  Pulmonary:     Effort: Pulmonary effort is normal.  Abdominal:     General: There is no distension.  Musculoskeletal:        General: Normal range of motion.  Skin:    General: Skin is warm.  Neurological:     General: No focal deficit present.     Mental Status: He is alert and oriented to person, place, and time.     ED Results / Procedures / Treatments   Labs (all labs ordered are listed, but only abnormal results are displayed) Labs Reviewed  COMPREHENSIVE METABOLIC PANEL - Abnormal; Notable for the following components:      Result Value   Potassium 3.0 (*)    Glucose, Bld 142 (*)    All other components within normal limits  CBC WITH DIFFERENTIAL/PLATELET    EKG None  Radiology CT Soft Tissue Neck W Contrast  Result Date: 11/20/2023 CLINICAL DATA:  Facial swelling EXAM: CT NECK WITH CONTRAST TECHNIQUE: Multidetector CT imaging of the neck was performed using the standard protocol following the bolus administration of intravenous contrast. RADIATION DOSE REDUCTION: This exam was performed according to the departmental dose-optimization program which includes automated exposure control, adjustment of the mA and/or kV according to patient size and/or use of iterative reconstruction technique. CONTRAST:  75mL OMNIPAQUE IOHEXOL 300 MG/ML  SOLN COMPARISON:  None Available. FINDINGS: Pharynx and larynx: Normal. No mass or swelling. Salivary glands: The left submandibular gland is markedly enlarged with surrounding inflammatory stranding and thickening of the overlying platysma. There is  no sialolithiasis. The right submandibular and both parotid glands are normal. Thyroid: Negative Lymph nodes: None enlarged or abnormal density. Vascular: Negative. Limited intracranial: Negative. Visualized orbits: None Mastoids and visualized paranasal sinuses: Clear. Skeleton: No acute or aggressive process. Upper chest: Negative. Other: None. IMPRESSION: Markedly enlarged left submandibular gland with surrounding inflammatory stranding and thickening of the overlying platysma. No sialolithiasis. Findings are consistent with acute sialoadenitis. Electronically Signed   By: Deatra Robinson M.D.   On: 11/20/2023 22:48    Procedures .Critical Care  Performed by: Elson Areas, PA-C Authorized by: Elson Areas, PA-C   Critical care provider statement:    Critical care time (minutes):  30   Critical care start time:  11/20/2023 9:00 PM   Critical care end time:  11/20/2023 11:56 PM   Critical care time was exclusive of:  Separately billable procedures and treating other patients   Critical care was time spent personally by me on the following activities:  Blood draw for specimens, development of treatment plan with patient or surrogate, discussions with consultants, evaluation of patient's response to treatment, obtaining history from patient or surrogate, review of old charts, re-evaluation of patient's condition, pulse oximetry, ordering and review of radiographic studies and ordering and performing treatments and interventions   Care discussed with: admitting provider       Medications Ordered in ED Medications  cefTRIAXone (ROCEPHIN) 2 g in sodium chloride 0.9 % 100 mL IVPB (has no administration in time range)  diphenhydrAMINE (BENADRYL) injection 25 mg (25 mg Intravenous Given 11/20/23 2104)  famotidine (PEPCID) IVPB 20 mg premix (0 mg Intravenous Stopped 11/20/23 2130)  methylPREDNISolone sodium succinate (SOLU-MEDROL) 125 mg/2 mL injection 125 mg (125 mg Intravenous Given 11/20/23 2104)   iohexol (OMNIPAQUE) 300 MG/ML solution 75 mL (75 mLs Intravenous Contrast Given 11/20/23 2150)    ED Course/ Medical Decision Making/ A&P                                 Medical Decision Making Patient complains of swelling to the left side of his neck.  Patient has a history of the same a year and a half ago.  Patient was hospitalized and given antibiotics and steroids.  Amount and/or Complexity of Data Reviewed Labs: ordered. Decision-making details documented in ED Course.    Details: Labs ordered reviewed and interpreted.  Potassium is 3.0.  CBC is normal. Radiology: ordered.    Details: CT scan neck shows markedly enlarged left submandibular gland with surrounding inflammatory stranding Discussion of management or test interpretation with external provider(s): Hospitalist will see for admission.   Risk Prescription drug management.   Pt seen by Dr. Wilkie Aye.  Pt given  solumedrol, benadryl and pepcid. Pt reevaluated,  Pt reports he feels some better.  Pt still clearing his throat frequently.   labs and clinical decision tools ie heart score, Chads2Vasc2 etc:1}      Final Clinical Impression(s) / ED Diagnoses Final diagnoses:  Neck swelling  Sialadenitis    Rx / DC Orders ED Discharge Orders     None         Elson Areas, PA-C 11/20/23 2357    Horton, Clabe Seal, DO 11/21/23 2109

## 2023-11-21 ENCOUNTER — Other Ambulatory Visit: Payer: Self-pay

## 2023-11-21 DIAGNOSIS — K1121 Acute sialoadenitis: Secondary | ICD-10-CM | POA: Diagnosis present

## 2023-11-21 DIAGNOSIS — E876 Hypokalemia: Secondary | ICD-10-CM

## 2023-11-21 LAB — CBC
HCT: 45.2 % (ref 39.0–52.0)
Hemoglobin: 14.8 g/dL (ref 13.0–17.0)
MCH: 29.8 pg (ref 26.0–34.0)
MCHC: 32.7 g/dL (ref 30.0–36.0)
MCV: 91.1 fL (ref 80.0–100.0)
Platelets: 301 10*3/uL (ref 150–400)
RBC: 4.96 MIL/uL (ref 4.22–5.81)
RDW: 15.1 % (ref 11.5–15.5)
WBC: 9.7 10*3/uL (ref 4.0–10.5)
nRBC: 0 % (ref 0.0–0.2)

## 2023-11-21 LAB — BASIC METABOLIC PANEL
Anion gap: 8 (ref 5–15)
BUN: 14 mg/dL (ref 8–23)
CO2: 25 mmol/L (ref 22–32)
Calcium: 9 mg/dL (ref 8.9–10.3)
Chloride: 105 mmol/L (ref 98–111)
Creatinine, Ser: 0.97 mg/dL (ref 0.61–1.24)
GFR, Estimated: >60 mL/min (ref >60)
Glucose, Bld: 152 mg/dL — ABNORMAL HIGH (ref 70–99)
Potassium: 4.1 mmol/L (ref 3.5–5.1)
Sodium: 138 mmol/L (ref 135–145)

## 2023-11-21 MED ORDER — ALLOPURINOL 100 MG PO TABS
100.0000 mg | ORAL_TABLET | Freq: Every day | ORAL | Status: DC
  Administered 2023-11-21: 100 mg via ORAL
  Filled 2023-11-21: qty 1

## 2023-11-21 MED ORDER — SENNOSIDES-DOCUSATE SODIUM 8.6-50 MG PO TABS: 1.0000 | ORAL_TABLET | Freq: Every evening | ORAL | Status: DC | PRN

## 2023-11-21 MED ORDER — SODIUM CHLORIDE 0.9 % IV SOLN
3.0000 g | Freq: Four times a day (QID) | INTRAVENOUS | Status: DC
  Administered 2023-11-21 (×2): 3 g via INTRAVENOUS
  Filled 2023-11-21 (×3): qty 8

## 2023-11-21 MED ORDER — AMOXICILLIN-POT CLAVULANATE 875-125 MG PO TABS
1.0000 | ORAL_TABLET | Freq: Two times a day (BID) | ORAL | Status: DC
  Administered 2023-11-21: 1 via ORAL
  Filled 2023-11-21: qty 1

## 2023-11-21 MED ORDER — ACETAMINOPHEN 650 MG RE SUPP: 650.0000 mg | Freq: Four times a day (QID) | RECTAL | Status: DC | PRN

## 2023-11-21 MED ORDER — AMOXICILLIN-POT CLAVULANATE 875-125 MG PO TABS: 1.0000 | ORAL_TABLET | Freq: Two times a day (BID) | ORAL | 0 refills | Status: AC

## 2023-11-21 MED ORDER — ENOXAPARIN SODIUM 40 MG/0.4ML IJ SOSY: 40.0000 mg | PREFILLED_SYRINGE | INTRAMUSCULAR | Status: DC

## 2023-11-21 MED ORDER — IBUPROFEN 200 MG PO TABS: 600.0000 mg | ORAL_TABLET | Freq: Four times a day (QID) | ORAL | Status: DC | PRN

## 2023-11-21 MED ORDER — FELODIPINE ER 5 MG PO TB24
5.0000 mg | ORAL_TABLET | Freq: Every day | ORAL | Status: DC
  Administered 2023-11-21: 5 mg via ORAL
  Filled 2023-11-21: qty 1

## 2023-11-21 MED ORDER — POTASSIUM CHLORIDE 20 MEQ PO PACK
60.0000 meq | PACK | Freq: Once | ORAL | Status: AC
  Administered 2023-11-21: 60 meq via ORAL
  Filled 2023-11-21: qty 3

## 2023-11-21 MED ORDER — ACETAMINOPHEN 325 MG PO TABS: 650.0000 mg | ORAL_TABLET | Freq: Four times a day (QID) | ORAL | Status: DC | PRN

## 2023-11-21 MED ORDER — MELATONIN 3 MG PO TABS: 3.0000 mg | ORAL_TABLET | Freq: Every evening | ORAL | Status: DC | PRN

## 2023-11-21 MED ORDER — ONDANSETRON HCL 4 MG/2ML IJ SOLN: 4.0000 mg | Freq: Four times a day (QID) | INTRAMUSCULAR | Status: DC | PRN

## 2023-11-21 MED ORDER — ONDANSETRON HCL 4 MG PO TABS: 4.0000 mg | ORAL_TABLET | Freq: Four times a day (QID) | ORAL | Status: DC | PRN

## 2023-11-21 NOTE — ED Notes (Signed)
ED TO INPATIENT HANDOFF REPORT  ED Nurse Name and Phone #: Ines Bloomer [phone]  S Name/Age/Gender Todd Rasmussen 72 y.o. male Room/Bed: WA19/WA19  Code Status   Code Status: Full Code  Home/SNF/Other Home Patient oriented to: self, place, time, and situation Is this baseline? Yes   Triage Complete: Triage complete  Chief Complaint Acute sialoadenitis [K11.21]  Triage Note PT woke up this morning with swelling to chin/neck. He went to work and it became worse. No obvious injury or wound. Tender to palpation. Feels hard. No oral swelling or issues with breathing. Denies SOB. States it has been worsening rapidly over course of the day. PT reports this happened a year ago and it was due to reaction/ allergy to a medication.    Allergies Allergies  Allergen Reactions   Losartan Swelling    Level of Care/Admitting Diagnosis ED Disposition     ED Disposition  Admit   Condition  --   Comment  Hospital Area: [facility] [[id number]]  Level of Care: Med-Surg [16]  May place patient in observation at [facility] or [facility] if equivalent level of care is available:: No  Covid Evaluation: Asymptomatic - no recent exposure (last 10 days) testing not required  Diagnosis: Acute sialoadenitis [478295]  Admitting Physician: Charlsie Quest [[id number]]  Attending Physician: Charlsie Quest [[id number]]          B Medical/Surgery History Past Medical History:  Diagnosis Date   Fatty liver 05/25/2022   Noted on [place] 01/2022   Gout 05/25/2022   Hypertension 05/25/2022   History reviewed. No pertinent surgical history.   A IV Location/Drains/Wounds Patient Lines/Drains/Airways Status     Active Line/Drains/Airways     Name Placement date Placement time Site Days   Peripheral IV 11/20/23 20 G Right Antecubital 11/20/23  2039  Antecubital  1            Intake/Output Last 24 hours  Intake/Output Summary (Last 24 hours) at 11/21/2023 0114 Last data filed at  11/21/2023 0009 Gross per 24 hour  Intake 200 ml  Output --  Net 200 ml    Labs/Imaging Results for orders placed or performed during the hospital encounter of 11/20/23 (from the past 48 hour(s))  Comprehensive metabolic panel     Status: Abnormal   Collection Time: 11/20/23  8:25 PM  Result Value Ref Range   Sodium 140 135 - 145 mmol/L   Potassium 3.0 (L) 3.5 - 5.1 mmol/L   Chloride 103 98 - 111 mmol/L   CO2 27 22 - 32 mmol/L   Glucose, Bld 142 (H) 70 - 99 mg/dL    Comment: Glucose reference range applies only to samples taken after fasting for at least 8 hours.   BUN 13 8 - 23 mg/dL   Creatinine, Ser 2.95 0.61 - 1.24 mg/dL   Calcium 9.0 8.9 - 28.4 mg/dL   Total Protein 7.7 6.5 - 8.1 g/dL   Albumin 3.9 3.5 - 5.0 g/dL   AST 22 15 - 41 U/L   ALT 22 0 - 44 U/L   Alkaline Phosphatase 57 38 - 126 U/L   Total Bilirubin 0.9 <1.2 mg/dL   GFR, Estimated >13 >24 mL/min    Comment: (NOTE) Calculated using the CKD-EPI Creatinine Equation (2021)    Anion gap 10 5 - 15    Comment: Performed at [facility], [address] [place], [place] [address]  CBC with Differential     Status: None  Collection Time: 11/20/23  8:25 PM  Result Value Ref Range   WBC 9.7 4.0 - 10.5 K/uL   RBC 4.94 4.22 - 5.81 MIL/uL   Hemoglobin 14.8 13.0 - 17.0 g/dL   HCT 16.1 09.6 - 04.5 %   MCV 90.1 80.0 - 100.0 fL   MCH 30.0 26.0 - 34.0 pg   MCHC 33.3 30.0 - 36.0 g/dL   RDW 40.9 81.1 - 91.4 %   Platelets 328 150 - 400 K/uL   nRBC 0.0 0.0 - 0.2 %   Neutrophils Relative % 58 %   Neutro Abs 5.6 1.7 - 7.7 K/uL   Lymphocytes Relative 32 %   Lymphs Abs 3.1 0.7 - 4.0 K/uL   Monocytes Relative 5 %   Monocytes Absolute 0.5 0.1 - 1.0 K/uL   Eosinophils Relative 4 %   Eosinophils Absolute 0.4 0.0 - 0.5 K/uL   Basophils Relative 1 %   Basophils Absolute 0.1 0.0 - 0.1 K/uL   Immature Granulocytes 0 %   Abs Immature Granulocytes 0.01 0.00 - 0.07 K/uL    Comment: Performed at [facility], [address], [place], [place] [address]   CT Soft Tissue Neck W Contrast  Result Date: 11/20/2023 CLINICAL DATA:  Facial swelling EXAM: CT NECK WITH CONTRAST TECHNIQUE: Multidetector CT imaging of the neck was performed using the standard protocol following the bolus administration of intravenous contrast. RADIATION DOSE REDUCTION: This exam was performed according to the departmental dose-optimization program which includes automated exposure control, adjustment of the mA and/or kV according to patient size and/or use of iterative reconstruction technique. CONTRAST:  75mL OMNIPAQUE IOHEXOL 300 MG/ML  SOLN COMPARISON:  None Available. FINDINGS: Pharynx and larynx: Normal. No mass or swelling. Salivary glands: The left submandibular gland is markedly enlarged with surrounding inflammatory stranding and thickening of the overlying platysma. There is no sialolithiasis. The right submandibular and both parotid glands are normal. Thyroid: Negative Lymph nodes: None enlarged or abnormal density. Vascular: Negative. Limited intracranial: Negative. Visualized orbits: None Mastoids and visualized paranasal sinuses: Clear. Skeleton: No acute or aggressive process. Upper chest: Negative. Other: None. IMPRESSION: Markedly enlarged left submandibular gland with surrounding inflammatory stranding and thickening of the overlying platysma. No sialolithiasis. Findings are consistent with acute sialoadenitis. Electronically Signed   By: Deatra Robinson M.D.   On: 11/20/2023 22:48    Pending Labs Unresulted Labs (From admission, onward)     Start     Ordered   11/21/23 0500  CBC  Tomorrow morning,   R        11/21/23 0109   11/21/23 0500  Basic metabolic panel  Tomorrow morning,   R        11/21/23 0109            Vitals/Pain Today's Vitals   11/20/23 2215 11/20/23 2329 11/20/23 2343 11/21/23 0113  BP: (!) 176/104 (!) 155/97  (!) 159/94  Pulse: 66 73  75  Resp: 18 18  15   Temp:  98.5 F  (36.9 C)  98 F (36.7 C)  TempSrc:  Oral  Oral  SpO2: 96% 95%  96%  PainSc:  0-No pain 0-No pain 0-No pain    Isolation Precautions No active isolations  Medications Medications  Ampicillin-Sulbactam (UNASYN) 3 g in sodium chloride 0.9 % 100 mL IVPB (3 g Intravenous New Bag/Given 11/21/23 0108)  melatonin tablet 3 mg (has no administration in time range)  allopurinol (ZYLOPRIM) tablet 100 mg (has no administration in time range)  felodipine (PLENDIL) 24 hr tablet 5 mg (has no administration in time range)  enoxaparin (LOVENOX) injection 40 mg (has no administration in time range)  acetaminophen (TYLENOL) tablet 650 mg (has no administration in time range)    Or  acetaminophen (TYLENOL) suppository 650 mg (has no administration in time range)  ondansetron (ZOFRAN) tablet 4 mg (has no administration in time range)    Or  ondansetron (ZOFRAN) injection 4 mg (has no administration in time range)  senna-docusate (Senokot-S) tablet 1 tablet (has no administration in time range)  ibuprofen (ADVIL) tablet 600 mg (has no administration in time range)  diphenhydrAMINE (BENADRYL) injection 25 mg (25 mg Intravenous Given 11/20/23 2104)  famotidine (PEPCID) IVPB 20 mg premix (0 mg Intravenous Stopped 11/20/23 2130)  methylPREDNISolone sodium succinate (SOLU-MEDROL) 125 mg/2 mL injection 125 mg (125 mg Intravenous Given 11/20/23 2104)  iohexol (OMNIPAQUE) 300 MG/ML solution 75 mL (75 mLs Intravenous Contrast Given 11/20/23 2150)  cefTRIAXone (ROCEPHIN) 2 g in sodium chloride 0.9 % 100 mL IVPB (0 g Intravenous Stopped 11/21/23 0009)  potassium chloride (KLOR-CON) packet 60 mEq (60 mEq Oral Given 11/21/23 0108)    Mobility walks     Focused Assessments See Chart   R Recommendations: See Admitting Provider Note  Report given to:   Additional Notes:  Pt alert and oriented x 4 and ambulates without assistance

## 2023-11-21 NOTE — Discharge Summary (Signed)
Physician Discharge Summary   Todd Rasmussen UEA:[id number] DOB: 02-12-1951 DOA: 11/20/2023  PCP: Creola Corn, MD  Admit date: 11/20/2023 Discharge date: 11/21/2023   Admitted From: Home Disposition:  Home Discharging physician: Lewie Chamber, MD Barriers to discharge: none  Recommendations at discharge: Continue routine care   Discharge Condition: stable CODE STATUS: Full Diet recommendation:  Diet Orders (From admission, onward)     Start     Ordered   11/21/23 0035  DIET SOFT Room service appropriate? Yes; Fluid consistency: Thin  Diet effective now       Question Answer Comment  Room service appropriate? Yes   Fluid consistency: Thin      11/21/23 0034   11/21/23 0000  Diet general        11/21/23 0922            Hospital Course: Todd Rasmussen is a 72 y.o. male with medical history significant for HTN, gout who is admitted with acute left submandibular sialoadenitis.  He has had prior history of right sided submandibular sialoadenitis with a 3 mm calcification noted at that time as well in June 2023. On CT on admission he was noted to have left submandibular gland with surrounding inflammatory stranding and thickening of the overlying platysma, and no noted sialolithiasis.  He was afebrile with no leukocytosis on admission.  He was started on Unasyn and admitted for observation overnight. He did have some improvement of pain and swelling along the left submandibular gland.  He was continued on Augmentin to complete 7-day course at discharge.  He was recommended to continue with hydration, heat packs to the area, and sour candy as needed to increase salivation.  The patient's acute and chronic medical conditions were treated accordingly. On day of discharge, patient was felt deemed stable for discharge. Patient/family member advised to call PCP or come back to ER if needed.   Principal Diagnosis: Acute sialoadenitis  Discharge Diagnoses: Active Hospital Problems    Diagnosis Date Noted   Acute left submandibular sialoadenitis 11/21/2023    Priority: 1.   Hypokalemia 11/21/2023    Priority: 2.   Hypertension 05/25/2022   Gout 05/25/2022    Resolved Hospital Problems  No resolved problems to display.     Discharge Instructions     Diet general   Complete by: As directed    Increase activity slowly   Complete by: As directed       Allergies as of 11/21/2023       Reactions   Losartan Swelling        Medication List     STOP taking these medications    famotidine 20 MG tablet Commonly known as: PEPCID   hydrALAZINE 50 MG tablet Commonly known as: APRESOLINE       TAKE these medications    allopurinol 100 MG tablet Commonly known as: ZYLOPRIM Take 100 mg by mouth daily.   amoxicillin-clavulanate 875-125 MG tablet Commonly known as: AUGMENTIN Take 1 tablet by mouth every 12 (twelve) hours for 7 days.   felodipine 5 MG 24 hr tablet Commonly known as: PLENDIL Take 5 mg by mouth daily.        Allergies  Allergen Reactions   Losartan Swelling    Consultations:   Procedures:   Discharge Exam: BP (!) 172/96 (BP Location: Right Arm)   Pulse 88   Temp 98.5 F (36.9 C) (Oral)   Resp 18   SpO2 98%  Physical Exam Constitutional:      General: He  is not in acute distress.    Appearance: Normal appearance.  HENT:     Head: Normocephalic and atraumatic.     Mouth/Throat:     Mouth: Mucous membranes are moist.  Eyes:     Extraocular Movements: Extraocular movements intact.  Neck:     Comments: Left submandibular gland palpable with mild tenderness Cardiovascular:     Rate and Rhythm: Normal rate and regular rhythm.  Pulmonary:     Effort: Pulmonary effort is normal. No respiratory distress.     Breath sounds: Normal breath sounds. No wheezing.  Abdominal:     General: Bowel sounds are normal. There is no distension.     Palpations: Abdomen is soft.     Tenderness: There is no abdominal tenderness.   Musculoskeletal:        General: Normal range of motion.     Cervical back: Normal range of motion and neck supple.  Skin:    General: Skin is warm and dry.  Neurological:     General: No focal deficit present.     Mental Status: He is alert.  Psychiatric:        Mood and Affect: Mood normal.        Behavior: Behavior normal.      The results of significant diagnostics from this hospitalization (including imaging, microbiology, ancillary and laboratory) are listed below for reference.   Microbiology: No results found for this or any previous visit (from the past 240 hour(s)).   Labs: BNP (last 3 results) No results for input(s): "BNP" in the last 8760 hours. Basic Metabolic Panel: Recent Labs  Lab 11/20/23 2025 11/21/23 0503  NA 140 138  K 3.0* 4.1  CL 103 105  CO2 27 25  GLUCOSE 142* 152*  BUN 13 14  CREATININE 1.07 0.97  CALCIUM 9.0 9.0   Liver Function Tests: Recent Labs  Lab 11/20/23 2025  AST 22  ALT 22  ALKPHOS 57  BILITOT 0.9  PROT 7.7  ALBUMIN 3.9   No results for input(s): "LIPASE", "AMYLASE" in the last 168 hours. No results for input(s): "AMMONIA" in the last 168 hours. CBC: Recent Labs  Lab 11/20/23 2025 11/21/23 0503  WBC 9.7 9.7  NEUTROABS 5.6  --   HGB 14.8 14.8  HCT 44.5 45.2  MCV 90.1 91.1  PLT 328 301   Cardiac Enzymes: No results for input(s): "CKTOTAL", "CKMB", "CKMBINDEX", "TROPONINI" in the last 168 hours. BNP: Invalid input(s): "POCBNP" CBG: No results for input(s): "GLUCAP" in the last 168 hours. D-Dimer No results for input(s): "DDIMER" in the last 72 hours. Hgb A1c No results for input(s): "HGBA1C" in the last 72 hours. Lipid Profile No results for input(s): "CHOL", "HDL", "LDLCALC", "TRIG", "CHOLHDL", "LDLDIRECT" in the last 72 hours. Thyroid function studies No results for input(s): "TSH", "T4TOTAL", "T3FREE", "THYROIDAB" in the last 72 hours.  Invalid input(s): "FREET3" Anemia work up No results for input(s):  "VITAMINB12", "FOLATE", "FERRITIN", "TIBC", "IRON", "RETICCTPCT" in the last 72 hours. Urinalysis No results found for: "COLORURINE", "APPEARANCEUR", "LABSPEC", "PHURINE", "GLUCOSEU", "HGBUR", "BILIRUBINUR", "KETONESUR", "PROTEINUR", "UROBILINOGEN", "NITRITE", "LEUKOCYTESUR" Sepsis Labs Recent Labs  Lab 11/20/23 2025 11/21/23 0503  WBC 9.7 9.7   Microbiology No results found for this or any previous visit (from the past 240 hour(s)).  Procedures/Studies: CT Soft Tissue Neck W Contrast  Result Date: 11/20/2023 CLINICAL DATA:  Facial swelling EXAM: CT NECK WITH CONTRAST TECHNIQUE: Multidetector CT imaging of the neck was performed using the standard protocol following the bolus administration  of intravenous contrast. RADIATION DOSE REDUCTION: This exam was performed according to the departmental dose-optimization program which includes automated exposure control, adjustment of the mA and/or kV according to patient size and/or use of iterative reconstruction technique. CONTRAST:  75mL OMNIPAQUE IOHEXOL 300 MG/ML  SOLN COMPARISON:  None Available. FINDINGS: Pharynx and larynx: Normal. No mass or swelling. Salivary glands: The left submandibular gland is markedly enlarged with surrounding inflammatory stranding and thickening of the overlying platysma. There is no sialolithiasis. The right submandibular and both parotid glands are normal. Thyroid: Negative Lymph nodes: None enlarged or abnormal density. Vascular: Negative. Limited intracranial: Negative. Visualized orbits: None Mastoids and visualized paranasal sinuses: Clear. Skeleton: No acute or aggressive process. Upper chest: Negative. Other: None. IMPRESSION: Markedly enlarged left submandibular gland with surrounding inflammatory stranding and thickening of the overlying platysma. No sialolithiasis. Findings are consistent with acute sialoadenitis. Electronically Signed   By: Deatra Robinson M.D.   On: 11/20/2023 22:48     Time coordinating  discharge: Over 30 minutes    Lewie Chamber, MD  [facility] 11/21/2023, 2:01 PM

## 2023-11-21 NOTE — Hospital Course (Addendum)
Todd Rasmussen is a 72 y.o. male with medical history significant for HTN, gout who is admitted with acute left submandibular sialoadenitis.  He has had prior history of right sided submandibular sialoadenitis with a 3 mm calcification noted at that time as well in June 2023. On CT on admission he was noted to have left submandibular gland with surrounding inflammatory stranding and thickening of the overlying platysma, and no noted sialolithiasis.  He was afebrile with no leukocytosis on admission.  He was started on Unasyn and admitted for observation overnight. He did have some improvement of pain and swelling along the left submandibular gland.  He was continued on Augmentin to complete 7-day course at discharge.  He was recommended to continue with hydration, heat packs to the area, and sour candy as needed to increase salivation.

## 2023-11-21 NOTE — TOC Transition Note (Signed)
Transition of Care [facility]) - CM/SW Discharge Note   Patient Details  Name: Todd Rasmussen MRN: [id number] Date of Birth: 05-27-1951  Transition of Care [facility]) CM/SW Contact:  Howell Rucks, RN Phone Number: 11/21/2023, 12:21 PM   Clinical Narrative:  Patient discharged prior to completion of MOON.            Patient Goals and CMS Choice      Discharge Placement                         Discharge Plan and Services Additional resources added to the After Visit Summary for                                       Social Determinants of Health (SDOH) Interventions SDOH Screenings   Food Insecurity: No Food Insecurity (11/21/2023)  Housing: Low Risk  (11/21/2023)  Transportation Needs: No Transportation Needs (11/21/2023)  Utilities: Not At Risk (11/21/2023)  Social Connections: Unknown (05/01/2022)   Received from [facility]  Tobacco Use: High Risk (11/20/2023)     Readmission Risk Interventions     No data to display

## 2024-01-30 ENCOUNTER — Ambulatory Visit: Payer: Medicare Other | Admitting: Podiatry

## 2024-01-30 DIAGNOSIS — L409 Psoriasis, unspecified: Secondary | ICD-10-CM | POA: Diagnosis not present

## 2024-01-30 DIAGNOSIS — R21 Rash and other nonspecific skin eruption: Secondary | ICD-10-CM

## 2024-01-30 MED ORDER — CLOBETASOL PROPIONATE 0.05 % EX CREA: 1.0000 | TOPICAL_CREAM | Freq: Two times a day (BID) | CUTANEOUS | 0 refills | Status: DC

## 2024-01-30 MED ORDER — METHYLPREDNISOLONE 4 MG PO TBPK: ORAL_TABLET | ORAL | 0 refills | Status: AC

## 2024-01-30 NOTE — Progress Notes (Signed)
Subjective:   Patient ID: Todd Rasmussen, male   DOB: 73 y.o.   MRN: [id number]   HPI Chief Complaint  Patient presents with   Rash    Rm#11 Rash on the tops of feet dark spots on the bottom of right foot getting worse. Itchiness no pain spots moving to left leg above ankle.   73 year old male presents as above concerns.  He states that he started getting some dark spots or rash of the foot with starting of up to the ankle and the leg and also to the bottom of his foot.  He has not had any recent treatment.  This is been ongoing several weeks.  He does not recall any injuries or changes.  He has not changed any detergents or shoes.  He does smoke occasionally as well as drink every couple of days but no recent viruses or illnesses.  States it does itch, and will burn some.    Review of Systems  All other systems reviewed and are negative.  Past Medical History:  Diagnosis Date   Fatty liver 05/25/2022   Noted on [place] 01/2022   Gout 05/25/2022   Hypertension 05/25/2022    No past surgical history on file.   Current Outpatient Medications:    allopurinol (ZYLOPRIM) 100 MG tablet, Take 100 mg by mouth daily., Disp: , Rfl:    clobetasol cream (TEMOVATE) 0.05 %, Apply 1 Application topically 2 (two) times daily., Disp: 30 g, Rfl: 0   felodipine (PLENDIL) 5 MG 24 hr tablet, Take 5 mg by mouth daily., Disp: , Rfl:    methylPREDNISolone (MEDROL DOSEPAK) 4 MG TBPK tablet, Take as directed, Disp: 21 tablet, Rfl: 0  Allergies  Allergen Reactions   Losartan Swelling           Objective:  Physical Exam  General: AAO x3, NAD  Dermatological: As pictured below there is dry, scaly rashes noted.  They do itch.  There is no open lesions.  There is also lesions do not go up the leg.  There is no drainage or pus noted today.  There is no open lesions.       Vascular: Dorsalis Pedis artery and Posterior Tibial artery pedal pulses are 2/4 bilateral with immedate capillary fill time.  There is  no pain with calf compression, swelling, warmth, erythema.   Neruologic: Grossly intact via light touch bilateral.   Musculoskeletal: No significant pain on exam. The lateral     Assessment:   Skin rash, psoriasis      Plan:  -Treatment options discussed including all alternatives, risks, and complications -Etiology of symptoms were discussed -Prescribed clobetasol cream.  Also given the acute nature of this and spreading of the leg we will do Medrol Dosepak short-term.  Discussed washing with antibacterial soap and water daily.  If there is no improvement we will do a punch biopsy next appointment.   Return in about 3 months (around 04/28/2024) for skin rash.  Vivi Barrack DPM

## 2024-02-18 ENCOUNTER — Encounter: Payer: Self-pay | Admitting: Podiatry

## 2024-02-18 ENCOUNTER — Ambulatory Visit: Payer: Medicare Other | Admitting: Podiatry

## 2024-02-18 DIAGNOSIS — L409 Psoriasis, unspecified: Secondary | ICD-10-CM

## 2024-02-18 DIAGNOSIS — R21 Rash and other nonspecific skin eruption: Secondary | ICD-10-CM

## 2024-02-21 NOTE — Progress Notes (Signed)
 Subjective: Chief Complaint  Patient presents with   Psoriasis    RM#12 3 wk bil rash follow up patient states rash is clearing up .   73 year old male presents the office with above concerns.  States that the rash is doing much better and is clearing up.  He has noticed the cream some days but overall seems to be improving.  Is not there is any spreading.  It is currently not itching.  Objective: AAO x3, NAD DP/PT pulses palpable bilaterally, CRT less than 3 seconds Right still remains although appears to be more dry in nature and clearing up.  There is no new lesions identified.  There is no surrounding erythema, drainage or pus or signs of infection.  No lesions. No pain with calf compression, swelling, warmth, erythema  Assessment: Skin rash, possible psoriasis  Plan: -All treatment options discussed with the patient including all alternatives, risks, complications.  -Overall doing much better.  Recommend continue steroid cream and he can also alternate this with moisturizer.  If symptoms were to spread or worsen or not improve would recommend follow-up with dermatology.  As the lesions are drying up and doing much better on hold off on biopsy today. -Patient encouraged to call the office with any questions, concerns, change in symptoms.   Vivi Barrack DPM

## 2024-02-26 ENCOUNTER — Other Ambulatory Visit: Payer: Self-pay | Admitting: Podiatry

## 2024-03-31 ENCOUNTER — Emergency Department (HOSPITAL_COMMUNITY)
Admission: EM | Admit: 2024-03-31 | Discharge: 2024-03-31 | Disposition: A | Discharge status: Home / Self Care | Attending: Emergency Medicine | Admitting: Emergency Medicine

## 2024-03-31 ENCOUNTER — Emergency Department (HOSPITAL_COMMUNITY)

## 2024-03-31 ENCOUNTER — Encounter (HOSPITAL_COMMUNITY): Payer: Self-pay

## 2024-03-31 ENCOUNTER — Other Ambulatory Visit: Payer: Self-pay

## 2024-03-31 DIAGNOSIS — K1120 Sialoadenitis, unspecified: Secondary | ICD-10-CM | POA: Insufficient documentation

## 2024-03-31 DIAGNOSIS — R220 Localized swelling, mass and lump, head: Secondary | ICD-10-CM | POA: Diagnosis present

## 2024-03-31 LAB — CBC WITH DIFFERENTIAL/PLATELET
Abs Immature Granulocytes: 0.03 10*3/uL (ref 0.00–0.07)
Basophils Absolute: 0.1 10*3/uL (ref 0.0–0.1)
Basophils Relative: 1 %
Eosinophils Absolute: 0.3 10*3/uL (ref 0.0–0.5)
Eosinophils Relative: 3 %
HCT: 44 % (ref 39.0–52.0)
Hemoglobin: 14.6 g/dL (ref 13.0–17.0)
Immature Granulocytes: 0 %
Lymphocytes Relative: 20 %
Lymphs Abs: 2 10*3/uL (ref 0.7–4.0)
MCH: 29.4 pg (ref 26.0–34.0)
MCHC: 33.2 g/dL (ref 30.0–36.0)
MCV: 88.5 fL (ref 80.0–100.0)
Monocytes Absolute: 0.8 10*3/uL (ref 0.1–1.0)
Monocytes Relative: 8 %
Neutro Abs: 6.6 10*3/uL (ref 1.7–7.7)
Neutrophils Relative %: 68 %
Platelets: 314 10*3/uL (ref 150–400)
RBC: 4.97 MIL/uL (ref 4.22–5.81)
RDW: 15.2 % (ref 11.5–15.5)
WBC: 9.7 10*3/uL (ref 4.0–10.5)
nRBC: 0 % (ref 0.0–0.2)

## 2024-03-31 LAB — BASIC METABOLIC PANEL WITH GFR
Anion gap: 9 (ref 5–15)
BUN: 14 mg/dL (ref 8–23)
CO2: 27 mmol/L (ref 22–32)
Calcium: 8.6 mg/dL — ABNORMAL LOW (ref 8.9–10.3)
Chloride: 103 mmol/L (ref 98–111)
Creatinine, Ser: 0.92 mg/dL (ref 0.61–1.24)
GFR, Estimated: >60 mL/min (ref >60)
Glucose, Bld: 109 mg/dL — ABNORMAL HIGH (ref 70–99)
Potassium: 3 mmol/L — ABNORMAL LOW (ref 3.5–5.1)
Sodium: 139 mmol/L (ref 135–145)

## 2024-03-31 MED ORDER — IOHEXOL 300 MG/ML  SOLN
75.0000 mL | Freq: Once | INTRAMUSCULAR | Status: AC | PRN
  Administered 2024-03-31: 75 mL via INTRAVENOUS

## 2024-03-31 MED ORDER — AMPICILLIN-SULBACTAM SODIUM 3 (2-1) G IJ SOLR
3.0000 g | Freq: Once | INTRAMUSCULAR | Status: AC
  Administered 2024-03-31: 3 g via INTRAVENOUS
  Filled 2024-03-31: qty 8

## 2024-03-31 MED ORDER — AMOXICILLIN-POT CLAVULANATE 875-125 MG PO TABS
1.0000 | ORAL_TABLET | Freq: Two times a day (BID) | ORAL | 0 refills | Status: DC
Start: 2024-03-31 — End: 2024-11-03

## 2024-03-31 MED ORDER — DEXAMETHASONE SODIUM PHOSPHATE 10 MG/ML IJ SOLN
8.0000 mg | Freq: Once | INTRAMUSCULAR | Status: AC
  Administered 2024-03-31: 8 mg via INTRAVENOUS
  Filled 2024-03-31: qty 1

## 2024-03-31 NOTE — ED Provider Notes (Signed)
 [place] EMERGENCY DEPARTMENT AT [facility] Provider Note   CSN: [id number] Arrival date & time: 03/31/24  0755     History  Chief Complaint  Patient presents with   Oral Swelling    Todd Rasmussen is a 73 y.o. male.  73 year old male who is here today for swelling on the right side of his neck.  He says that this began a couple of days ago.  Patient had a similar episode in December of this past year, had sublingual sialadenitis.  Remained in the hospital overnight, was discharged on antibiotics.        Home Medications Prior to Admission medications   Medication Sig Start Date End Date Taking? Authorizing Provider  amoxicillin-clavulanate (AUGMENTIN) 875-125 MG tablet Take 1 tablet by mouth every 12 (twelve) hours. 03/31/24  Yes Anders Simmonds T, DO  allopurinol (ZYLOPRIM) 100 MG tablet Take 100 mg by mouth daily. 05/19/22   [provider]  clobetasol cream (TEMOVATE) 0.05 % APPLY TO AFFECTED AREA TWICE A DAY 02/26/24   Vivi Barrack, DPM  felodipine (PLENDIL) 5 MG 24 hr tablet Take 5 mg by mouth daily.    [provider]  methylPREDNISolone (MEDROL DOSEPAK) 4 MG TBPK tablet Take as directed Patient not taking: Reported on 02/18/2024 01/30/24   Vivi Barrack, DPM      Allergies    Losartan    Review of Systems   Review of Systems  Physical Exam Updated Vital Signs BP (!) 166/96   Pulse 79   Temp 98.8 F (37.1 C) (Oral)   Resp 19   Ht 5\' 10"  (1.778 m)   Wt 122.8 kg   SpO2 93%   BMI 38.84 kg/m  Physical Exam Vitals reviewed.  Constitutional:      Appearance: He is not toxic-appearing.  HENT:     Head: Normocephalic.     Salivary Glands: Right salivary gland is diffusely enlarged.     Comments: No pooling of secretions.  No protrusion of the tongue.  No evidence of floor of mouth infection.  Dentition adequate. Eyes:     Pupils: Pupils are equal, round, and reactive to light.  Neck:     Comments: Right-sided submental  swelling.  No stridor. Cardiovascular:     Rate and Rhythm: Normal rate.  Pulmonary:     Effort: Pulmonary effort is normal.  Musculoskeletal:     Cervical back: Normal range of motion.  Neurological:     Mental Status: He is alert.     ED Results / Procedures / Treatments   Labs (all labs ordered are listed, but only abnormal results are displayed) Labs Reviewed  BASIC METABOLIC PANEL WITH GFR - Abnormal; Notable for the following components:      Result Value   Potassium 3.0 (*)    Glucose, Bld 109 (*)    Calcium 8.6 (*)    All other components within normal limits  CBC WITH DIFFERENTIAL/PLATELET    EKG EKG Interpretation Date/Time:  Tuesday March 31 2024 08:02:09 EDT Ventricular Rate:  84 PR Interval:  211 QRS Duration:  152 QT Interval:  451 QTC Calculation: 534 R Axis:   -74  Text Interpretation: Sinus rhythm RBBB and LAFB Left ventricular hypertrophy Confirmed by Anders Simmonds [phone]) on 03/31/2024 9:20:12 AM  Radiology CT Soft Tissue Neck W Contrast Result Date: 03/31/2024 CLINICAL DATA:  Neck mass, right-sided throat and neck swelling for 2 days. EXAM: CT NECK WITH CONTRAST TECHNIQUE: Multidetector CT imaging of the neck  was performed using the standard protocol following the bolus administration of intravenous contrast. RADIATION DOSE REDUCTION: This exam was performed according to the departmental dose-optimization program which includes automated exposure control, adjustment of the mA and/or kV according to patient size and/or use of iterative reconstruction technique. CONTRAST:  75mL OMNIPAQUE IOHEXOL 300 MG/ML  SOLN COMPARISON:  CT neck 11/20/2023. FINDINGS: Pharynx and larynx: There is edema along the medial aspect of the right submandibular gland extending into the parapharyngeal space with subtle effacement of the right lateral aspect of the oro pharyngeal airway without airway narrowing. There is no evidence of edema extending into the floor of mouth. There is  enlargement of the right submandibular duct noted measuring up to 4.5 mm in diameter. There is a 5 mm calculus anteriorly along the sublingual space likely within the submandibular duct. The nasopharynx is symmetric. Symmetric appearance of the palatine tonsils. There is no evidence of peritonsillar abscess. The base of tongue is unremarkable. Epiglottis is normal. No retropharyngeal effusion. The aryepiglottic folds and piriform sinuses are symmetric. Symmetric appearance of the vocal folds. Salivary glands: There is prominent asymmetric enlargement of the right submandibular gland with associated enhancement. Prominence of ducts within the gland which are new since the prior CT. There are multiple small calculi within the right submandibular gland likely involving the ducts which are increased since the prior study. The largest calculus measures up to 3 mm in diameter. There is stranding in the adjacent soft tissues of the right submandibular space. Edema noted extending medial to the right submandibular gland. The left submandibular gland is unremarkable. Symmetric appearance of the parotid glands. Thyroid: Similar appearance of 1 cm nodule in the inferior left thyroid lobe. Lymph nodes: None enlarged or abnormal density. Vascular: Atherosclerosis at the carotid bifurcations without significant stenosis appreciated. Limited intracranial: Negative. Visualized orbits: Negative. Mastoids and visualized paranasal sinuses: Clear. Skeleton: No acute or aggressive process. Upper chest: Negative. Other: Stranding in the subcutaneous tissues overlying the right submandibular region. There is asymmetric thickening of the right platysma. Stranding extends into the subcutaneous tissues of the right anterior neck. IMPRESSION: Enlargement and enhancement of the right submandibular gland compatible with sialoadenitis. There are multiple calculi within the gland measuring up to 3 mm with prominence of ducts. Additional  enlargement of the right submandibular duct with a 5 mm calculus noted anteriorly in the sublingual space suggestive of sialolithiasis and sialodocholithiasis. Edema along the medial aspect of the right submandibular gland extending into the pharyngeal mucosal space resulting in subtle effacement of the right lateral aspect of the oropharyngeal airway without significant airway narrowing. Electronically Signed   By: Emily Filbert M.D.   On: 03/31/2024 11:11    Procedures Procedures    Medications Ordered in ED Medications  Ampicillin-Sulbactam (UNASYN) 3 g in sodium chloride 0.9 % 100 mL IVPB (3 g Intravenous New Bag/Given 03/31/24 0842)  dexamethasone (DECADRON) injection 8 mg (8 mg Intravenous Given 03/31/24 0840)  iohexol (OMNIPAQUE) 300 MG/ML solution 75 mL (75 mLs Intravenous Contrast Given 03/31/24 0948)    ED Course/ Medical Decision Making/ A&P                                 Medical Decision Making 73 year old male who is here today for right sided submental space swelling.  Plan-patient's exam is consistent with a right sublingual sialadenitis.  With this history, believe this is likely the cause.  Will obtain repeat imaging  of the patient's neck.  Will provide him with some steroids, antibiotics.  Basic labs ordered.  Reassessment 11:20 AM-patient with no leukocytosis.  CT imaging consistent with sublingual salivary gland infection, sialoadenitis.  Counseled patient on sialagogues, will discharge with antibiotics.  At time discharge, patient overall looking well.  Swelling appears improved.  Amount and/or Complexity of Data Reviewed Labs: ordered. Radiology: ordered.  Risk Prescription drug management.           Final Clinical Impression(s) / ED Diagnoses Final diagnoses:  Sialadenitis    Rx / DC Orders ED Discharge Orders          Ordered    amoxicillin-clavulanate (AUGMENTIN) 875-125 MG tablet  Every 12 hours        03/31/24 1124               Afton Horse T, DO 03/31/24 1125

## 2024-03-31 NOTE — ED Triage Notes (Signed)
 Pt reports with right sided throat/ neck swelling x 2 days. Pt states that it is uncomfortable to breath, swallow, and talk.

## 2024-03-31 NOTE — Discharge Instructions (Addendum)
 While you were in the emergency room, you had blood work done that was normal.  Your CT scan shows the same infection in that salivary gland that you had previously.  You can take Augmentin 2 times per day for the next 10 days for this.  Try to suck on hard candy to help reduce the swelling in the area.  I have included the telephone number for an ear nose and throat doctor.  I do recommend calling them this week to establish a follow-up appointment to discuss long-term management.  Return to the emergency room if you develop increased difficulty swallowing, or difficulty with your breathing.

## 2024-04-16 ENCOUNTER — Encounter (INDEPENDENT_AMBULATORY_CARE_PROVIDER_SITE_OTHER): Payer: Self-pay | Admitting: Otolaryngology

## 2024-04-16 ENCOUNTER — Ambulatory Visit (INDEPENDENT_AMBULATORY_CARE_PROVIDER_SITE_OTHER): Admitting: Otolaryngology

## 2024-04-16 VITALS — BP 143/82 | HR 75 | Ht 71.0 in | Wt 230.0 lb

## 2024-04-16 DIAGNOSIS — K1123 Chronic sialoadenitis: Secondary | ICD-10-CM | POA: Diagnosis not present

## 2024-04-16 DIAGNOSIS — E041 Nontoxic single thyroid nodule: Secondary | ICD-10-CM

## 2024-04-16 DIAGNOSIS — K115 Sialolithiasis: Secondary | ICD-10-CM | POA: Diagnosis not present

## 2024-04-16 NOTE — Progress Notes (Signed)
 ENT CONSULT:  Reason for Consult: recurrent right submandibular swelling    HPI: Discussed the use of AI scribe software for clinical note transcription with the patient, who gave verbal consent to proceed.  History of Present Illness Todd Rasmussen "Todd Rasmussen" is a 73 year old male who presents with recurrent swelling under the right jaw due to salivary stones/sialoadenitis.  He has experienced swelling under the right jaw, primarily on the right side, on two occasions (dec 2024, and then again 2 weeks ago). Both times, he sought treatment in the emergency room, where he received a prescription for amoxicillin  and underwent CT scans. During the second episode, it was clarified that the swelling might be due to stones or calcium deposits in the salivary glands.  Following the recent episode, the swelling has significantly reduced, and he states it is 'ninety-eight percent' gone. He expelled small stones from his mouth on two occasions, describing them as 'small, light, round stones. The swelling and pain were significant, with the sensation likened to gout, where symptoms developed days before becoming too painful.  He drinks a lot of water, keeping a case of water in his car. During a CT neck scan in December 2024, no stones were found, and he was diagnosed with sialoadenitis, indicating inflammation of the salivary gland without identified stones.  In addition to the salivary gland issues, a small thyroid nodule was found during his recent evaluation, but it was noted to be very small and not requiring follow-up at this time.  No current swelling under the jaw, reports significant reduction in swelling and pain.    Past Medical History:  Diagnosis Date   Fatty liver 05/25/2022   Noted on US  01/2022   Gout 05/25/2022   Hypertension 05/25/2022    History reviewed. No pertinent surgical history.  Family History  Problem Relation Age of Onset   Diabetes Mother     Social History:  reports that  he has been smoking cigarettes. He has never used smokeless tobacco. He reports current alcohol use. No history on file for drug use.  Allergies:  Allergies  Allergen Reactions   Losartan Swelling    Medications: I have reviewed the patient's current medications.  The PMH, PSH, Medications, Allergies, and SH were reviewed and updated.  ROS: Constitutional: Negative for fever, weight loss and weight gain. Cardiovascular: Negative for chest pain and dyspnea on exertion. Respiratory: Is not experiencing shortness of breath at rest. Gastrointestinal: Negative for nausea and vomiting. Neurological: Negative for headaches. Psychiatric: The patient is not nervous/anxious  Blood pressure (!) 143/82, pulse 75, height 5\' 11"  (1.803 m), weight 230 lb (104.3 kg), SpO2 96%. Body mass index is 32.08 kg/m.  PHYSICAL EXAM:  Exam: General: Well-developed, well-nourished Communication and Voice: Clear pitch and clarity Respiratory Respiratory effort: Equal inspiration and expiration without stridor Cardiovascular Peripheral Vascular: Warm extremities with equal color/perfusion Eyes: No nystagmus with equal extraocular motion bilaterally Neuro/Psych/Balance: Patient oriented to person, place, and time; Appropriate mood and affect; Gait is intact with no imbalance; Cranial nerves I-XII are intact Head and Face Inspection: Normocephalic and atraumatic without mass or lesion Palpation: Facial skeleton intact without bony stepoffs Salivary Glands: No mass or tenderness Facial Strength: Facial motility symmetric and full bilaterally ENT Pinna: External ear intact and fully developed External canal: Canal is patent with intact skin Tympanic Membrane: Clear and mobile External Nose: No scar or anatomic deformity Internal Nose: Septum is relatively straight on anterior rhinoscopy.  Lips, Teeth, and gums: Mucosa and teeth intact  and viable TMJ: No pain to palpation with full mobility Oral  cavity/oropharynx: No erythema or exudate, no lesions present No palpable stones and clear salivary drainage from bilateral [place]arton ducts Neck Neck and Trachea: Midline trachea without mass or lesion Thyroid: No mass or nodularity Lymphatics: No lymphadenopathy   Studies Reviewed: CT neck 11/20/23 IMPRESSION: Markedly enlarged left submandibular gland with surrounding inflammatory stranding and thickening of the overlying platysma. No sialolithiasis. Findings are consistent with acute sialoadenitis.   CT neck w/con 03/31/24 Pharynx and larynx: There is edema along the medial aspect of the right submandibular gland extending into the parapharyngeal space with subtle effacement of the right lateral aspect of the oro pharyngeal airway without airway narrowing. There is no evidence of edema extending into the floor of mouth. There is enlargement of the right submandibular duct noted measuring up to 4.5 mm in diameter. There is a 5 mm calculus anteriorly along the sublingual space likely within the submandibular duct.   The nasopharynx is symmetric. Symmetric appearance of the palatine tonsils. There is no evidence of peritonsillar abscess. The base of tongue is unremarkable. Epiglottis is normal. No retropharyngeal effusion. The aryepiglottic folds and piriform sinuses are symmetric. Symmetric appearance of the vocal folds.   Salivary glands: There is prominent asymmetric enlargement of the right submandibular gland with associated enhancement. Prominence of ducts within the gland which are new since the prior CT. There are multiple small calculi within the right submandibular gland likely involving the ducts which are increased since the prior study. The largest calculus measures up to 3 mm in diameter. There is stranding in the adjacent soft tissues of the right submandibular space. Edema noted extending medial to the right submandibular gland. The left submandibular gland is  unremarkable. Symmetric appearance of the parotid glands.   Thyroid: Similar appearance of 1 cm nodule in the inferior left thyroid lobe.   Lymph nodes: None enlarged or abnormal density.   Vascular: Atherosclerosis at the carotid bifurcations without significant stenosis appreciated.   Limited intracranial: Negative.   Visualized orbits: Negative.   Mastoids and visualized paranasal sinuses: Clear.   Skeleton: No acute or aggressive process.   Upper chest: Negative.   Other: Stranding in the subcutaneous tissues overlying the right submandibular region. There is asymmetric thickening of the right platysma. Stranding extends into the subcutaneous tissues of the right anterior neck. IMPRESSION: Enlargement and enhancement of the right submandibular gland compatible with sialoadenitis. There are multiple calculi within the gland measuring up to 3 mm with prominence of ducts. Additional enlargement of the right submandibular duct with a 5 mm calculus noted anteriorly in the sublingual space suggestive of sialolithiasis and sialodocholithiasis.   Edema along the medial aspect of the right submandibular gland extending into the pharyngeal mucosal space resulting in subtle effacement of the right lateral aspect of the oropharyngeal airway without significant airway narrowing.    Assessment/Plan: Encounter Diagnosis  Name Primary?   Chronic sialoadenitis Yes    Assessment and Plan Assessment & Plan Sialolithiasis and recurrent sialoadenitis  Recurrent sialolithiasis with resolved recent episode due to stone expulsion and abx. Asymptomatic currently, but risk of recurrence remains. We discussed care for sialoadenitis. Discussed prognosis, which in his case is overall positive since he extruded the stone located near the right [facility] duct opening as I no longer feel it with bimanual palpation on exam. Both SMG glands are normal size without tenderness today.  - Advised warm  compresses and sour candies to enhance salivary flow. - Encouraged  hydration to prevent stone formation. - Will prescribe steroid antibiotic if symptoms recur. - Instruct to call office if symptoms recur for further management.   Thyroid nodule Incidental small solitary thyroid nodule. No follow-up required unless it gets bigger in size in the future  - consider thyroid U/S in 1-2 yrs  Thank you for allowing me to participate in the care of this patient. Please do not hesitate to contact me with any questions or concerns.   Artice Last, MD Otolaryngology [facility] Phone: [phone] Fax: [phone]    04/16/2024, 10:32 AM

## 2024-04-16 NOTE — Patient Instructions (Signed)
 Sialoadenitis (Salivary Gland Infection)    Salivary glands make saliva, or spit. An infection in these glands can make the glands swell and hurt.  An infection can happen when bacteria gets into the gland. This is more common in people who have diabetes, poor tooth care, or stones in these glands. Bacteria can build up and cause an infection if you don't get enough fluids. It can also happen if the flow of saliva gets blocked by a small stone in the gland. A virus can also cause an infection.  Your care depends on the cause. If the problem is caused by bacteria, your doctor may prescribe antibiotics.  Home treatment may help. You can drink more fluids or suck on sugar-free lemon drops to increase the flow of saliva.  Follow-up care is a key part of your treatment and safety. Be sure to make and go to all appointments, and call your doctor if you are having problems. It's also a good idea to know your test results and keep a list of the medicines you take.  How can you care for yourself at home?  If your doctor prescribed antibiotics, take them as directed. Do not stop taking them just because you feel better. You need to take the full course of antibiotics.  Take an over-the-counter pain medicine if needed, such as acetaminophen (Tylenol), ibuprofen (Advil, Motrin), or naproxen (Aleve). Be safe with medicines. Read and follow all instructions on the label.  Do not take two or more pain medicines at the same time unless the doctor told you to. Many pain medicines have acetaminophen, which is Tylenol. Too much acetaminophen (Tylenol) can be harmful.  Drink plenty of fluids. If you have kidney, heart, or liver disease and have to limit fluids, talk with your doctor before you increase the amount of fluids you drink.  Put an ice or heat pack (whichever feels better) on the swollen jaw for 10 to 20 minutes at a time. Put a thin cloth between the ice or heat pack and your skin.  Suck on ice  chips or ice treats such as sugar-free flavoured ice pops. Eat soft foods that do not have to be chewed much.  Use sugar-free gum or candies such as lemon drops. They increase saliva.  Avoid over-the-counter medicines that can give you a dry mouth. These medicines include antihistamines, such as diphenhydramine (Benadryl) or chlorpheniramine.  Gently massage the infected gland.

## 2024-11-03 ENCOUNTER — Encounter (INDEPENDENT_AMBULATORY_CARE_PROVIDER_SITE_OTHER): Payer: Self-pay | Admitting: Otolaryngology

## 2024-11-03 ENCOUNTER — Ambulatory Visit (INDEPENDENT_AMBULATORY_CARE_PROVIDER_SITE_OTHER): Admitting: Otolaryngology

## 2024-11-03 VITALS — BP 157/95 | HR 81 | Ht 71.0 in

## 2024-11-03 DIAGNOSIS — K115 Sialolithiasis: Secondary | ICD-10-CM

## 2024-11-03 DIAGNOSIS — K1123 Chronic sialoadenitis: Secondary | ICD-10-CM | POA: Diagnosis not present

## 2024-11-03 MED ORDER — AMOXICILLIN-POT CLAVULANATE 875-125 MG PO TABS: 1.0000 | ORAL_TABLET | Freq: Two times a day (BID) | ORAL | 0 refills | Status: AC

## 2024-11-03 NOTE — Progress Notes (Signed)
 Dear Dr. Onita, Here is my assessment for our mutual patient, Todd Rasmussen. Thank you for allowing me the opportunity to care for your patient. Please do not hesitate to contact me should you have any other questions. Sincerely, Dr. Eldora Blanch  Otolaryngology Clinic Note Referring provider: Dr. Onita HPI:  Todd Rasmussen is a 73 y.o. male kindly referred by Dr. Onita for evaluation of recurrent sialadenitis  Initial visit with me (10/2024): Discussed the use of AI scribe software for clinical note transcription with the patient, who gave verbal consent to proceed.  History of Present Illness Todd Rasmussen is a 73 year old male who presents with recurrent submandibular swelling  Initially seen by Dr. Okey in May 2025 for right sialoadenitis for which she recommended conservative management.   He did well for a few months but now had acute onset LEFT sided pain submandibular swelling since last Wednesday, with less severe pain and swelling compared to previous episodes. He did sour candy and massages, and his swelling has essentially resolved but he had several questions regarding the etiology and nature of his recurrent symptoms. Abx do improve his symptoms  He does have a history of these episodes, at least 3 documented for which he was diagnosed with right submandibular siaolithiasis and on left with sialdenitis. He has had multiple CT. During the last right-sided episode, a stone was expelled. He has used sour candy, massage, and warm compresses for symptom management.   He is planning to travel soon and is concerned about managing symptoms while away. No history of diabetes, Sjogren's syndrome, or dry mouth symptoms.  Patient currently denies: - dysphagia, odynophagia, unintentional weight loss - changes in voice, shortness of breath, hemoptysis - ear pain, neck masses  H&N Surgery: denies Personal or FHx of bleeding dz or anesthesia difficulty: no   GLP-1:  no AP/AC: no  Independent Review of Additional Tests or Records:  Dr. Okey notes (04/16/2024) reviewed CBC and BMP 03/31/2024: WBC 9.7; BUN/Cr 14/0.92 Imaging CT Neck 05/24/2022, 11/20/2023, and 03/31/2024 reviewed and interpreted --- 2023 noted right submandibular sialadenitis with right submandibular stone, 2024 with left SMG sialadenitis without stone, in 2025 noted right SMG swelling and stone within the gland/very distal and another smaller stone close to papilla. ED Notes x3 (for prior sialdenitis episodes) also reviewed  PMH/Meds/All/SocHx/FamHx/ROS:   Past Medical History:  Diagnosis Date   Fatty liver 05/25/2022   Noted on US  01/2022   Gout 05/25/2022   Hypertension 05/25/2022     History reviewed. No pertinent surgical history.  Family History  Problem Relation Age of Onset   Diabetes Mother      Social Connections: Unknown (05/01/2022)   Received from [facility]   Social Network    Social Network: Not on file      Current Outpatient Medications:    allopurinol  (ZYLOPRIM ) 100 MG tablet, Take 100 mg by mouth daily., Disp: , Rfl:    amoxicillin -clavulanate (AUGMENTIN ) 875-125 MG tablet, Take 1 tablet by mouth 2 (two) times daily for 10 days., Disp: 20 tablet, Rfl: 0   clobetasol  cream (TEMOVATE ) 0.05 %, APPLY TO AFFECTED AREA TWICE A DAY, Disp: 30 g, Rfl: 0   felodipine  (PLENDIL ) 5 MG 24 hr tablet, Take 5 mg by mouth daily., Disp: , Rfl:    methylPREDNISolone  (MEDROL  DOSEPAK) 4 MG TBPK tablet, Take as directed (Patient not taking: Reported on 11/03/2024), Disp: 21 tablet, Rfl: 0   Physical Exam:   BP (!) 157/95 (BP Location: Right Arm, Patient  Position: Sitting, Cuff Size: Large)   Pulse 81   Ht 5' 11 (1.803 m)   SpO2 93%   BMI 32.08 kg/m   Salient findings:  CN II-XII intact Bilateral EAC clear and TM intact with well pneumatized middle ear spaces Anterior rhinoscopy: Septum intact; bilateral inferior turbinates without significant hypertrophy No lesions of oral  cavity/oropharynx; dentition fair; b/l submandibular ducts able to express clear saliva; oral cavity fairly moist No obviously palpable neck masses/lymphadenopathy/thyromegaly; no tenderness or fluctuance left neck; b/l SMG glands palpable, left slightly firmer and larger than right; unable to appreciate any stones today No respiratory distress or stridor  Seprately Identifiable Procedures:  Prior to initiating any procedures, risks/benefits/alternatives were explained to the patient and verbal consent obtained. None today  Impression & Plans:  Todd Rasmussen is a 73 y.o. male with:  1. Chronic sialoadenitis   2. Salivary stone    Recurrent bilateral sialadenitis with sialolithiasis on right Recurrent sialadenitis with sialolithiasis, currently symptomatic but improving. Given recurrent issues, we discussed management. Unable to perform sialendoscopy here at [facility] which may be useful if continues to have exacerbations --- especially on left. On right, the stone is quite distal so not sure if able to reach via sialendoscopy so excision may be best option if still there.   He is not ready for any of this at this point and would like to continue conservative management, Will give him rescue prescription for abx in case he has exacerbation soon (Augmentin  BID x10d)  - Advised sour candy, massage, and warm compresses for symptom management. - If episodes recur, will refer to tertiary care center for possible sialendoscopy  See below regarding exact medications prescribed this encounter including dosages and route: Meds ordered this encounter  Medications   amoxicillin -clavulanate (AUGMENTIN ) 875-125 MG tablet    Sig: Take 1 tablet by mouth 2 (two) times daily for 10 days.    Dispense:  20 tablet    Refill:  0      Thank you for allowing me the opportunity to care for your patient. Please do not hesitate to contact me should you have any other questions.  Sincerely, Eldora Blanch,  MD Otolaryngologist (ENT), [facility] Phone: [phone] Fax: [phone]  11/08/2024, 3:44 PM   MDM:  I have personally spent 42 minutes involved in face-to-face and non-face-to-face activities for this patient on the day of the visit.  Professional time spent excludes any procedures performed but includes the following activities, in addition to those noted in the documentation: preparing to see the patient (review of outside documentation and results), performing a medically appropriate examination, counseling, ordering medications (Augmentin ), documenting in the electronic health record, independently interpreting results (CT x3 see above).

## 2024-11-03 NOTE — Patient Instructions (Signed)
 If swelling happens, take augmentin  one tablet twice daily for 10 days.
